# Patient Record
Sex: Female | Born: 1985 | Race: Black or African American | Hispanic: No | Marital: Single | Smoking: Current every day smoker
Health system: Southern US, Community
[De-identification: ages and names within clinical notes are randomized; demographics above are authoritative.]

## PROBLEM LIST (undated history)

## (undated) HISTORY — PX: OTHER SURGICAL HISTORY: SHX169

---

## 2009-01-12 ENCOUNTER — Emergency Department (HOSPITAL_BASED_OUTPATIENT_CLINIC_OR_DEPARTMENT_OTHER): Admission: EM | Admit: 2009-01-12 | Discharge: 2009-01-12 | Payer: Self-pay | Admitting: Emergency Medicine

## 2009-01-31 ENCOUNTER — Ambulatory Visit: Payer: Self-pay | Admitting: Interventional Radiology

## 2009-01-31 ENCOUNTER — Emergency Department (HOSPITAL_BASED_OUTPATIENT_CLINIC_OR_DEPARTMENT_OTHER): Admission: EM | Admit: 2009-01-31 | Discharge: 2009-01-31 | Payer: Self-pay | Admitting: Emergency Medicine

## 2009-02-16 ENCOUNTER — Emergency Department (HOSPITAL_BASED_OUTPATIENT_CLINIC_OR_DEPARTMENT_OTHER): Admission: EM | Admit: 2009-02-16 | Discharge: 2009-02-16 | Payer: Self-pay | Admitting: Emergency Medicine

## 2009-07-03 ENCOUNTER — Emergency Department (HOSPITAL_BASED_OUTPATIENT_CLINIC_OR_DEPARTMENT_OTHER): Admission: EM | Admit: 2009-07-03 | Discharge: 2009-07-03 | Payer: Self-pay | Admitting: Emergency Medicine

## 2010-05-12 ENCOUNTER — Emergency Department (HOSPITAL_BASED_OUTPATIENT_CLINIC_OR_DEPARTMENT_OTHER)
Admission: EM | Admit: 2010-05-12 | Discharge: 2010-05-12 | Payer: Self-pay | Source: Home / Self Care | Admitting: Emergency Medicine

## 2010-08-24 LAB — URINALYSIS, ROUTINE W REFLEX MICROSCOPIC
Bilirubin Urine: NEGATIVE
Nitrite: NEGATIVE
Specific Gravity, Urine: 1.024 (ref 1.005–1.030)
Urobilinogen, UA: 1 mg/dL (ref 0.0–1.0)

## 2010-08-24 LAB — BASIC METABOLIC PANEL
CO2: 29 mEq/L (ref 19–32)
Chloride: 106 mEq/L (ref 96–112)
Creatinine, Ser: 0.8 mg/dL (ref 0.4–1.2)
GFR calc Af Amer: >60 mL/min (ref >60)
Potassium: 4 mEq/L (ref 3.5–5.1)

## 2010-08-24 LAB — URINE CULTURE

## 2010-08-24 LAB — PREGNANCY, URINE: Preg Test, Ur: POSITIVE

## 2010-10-23 ENCOUNTER — Emergency Department (HOSPITAL_BASED_OUTPATIENT_CLINIC_OR_DEPARTMENT_OTHER)
Admission: EM | Admit: 2010-10-23 | Discharge: 2010-10-23 | Disposition: A | Discharge status: Home / Self Care | Payer: Self-pay | Attending: Emergency Medicine | Admitting: Emergency Medicine

## 2010-10-23 DIAGNOSIS — J329 Chronic sinusitis, unspecified: Secondary | ICD-10-CM | POA: Insufficient documentation

## 2010-10-23 DIAGNOSIS — J029 Acute pharyngitis, unspecified: Secondary | ICD-10-CM | POA: Insufficient documentation

## 2010-10-23 LAB — URINALYSIS, ROUTINE W REFLEX MICROSCOPIC
Nitrite: NEGATIVE
Specific Gravity, Urine: 1.029 (ref 1.005–1.030)
pH: 6 (ref 5.0–8.0)

## 2010-10-23 LAB — URINE MICROSCOPIC-ADD ON

## 2010-10-23 LAB — PREGNANCY, URINE: Preg Test, Ur: NEGATIVE

## 2011-01-02 ENCOUNTER — Emergency Department (HOSPITAL_BASED_OUTPATIENT_CLINIC_OR_DEPARTMENT_OTHER)
Admission: EM | Admit: 2011-01-02 | Discharge: 2011-01-02 | Disposition: A | Discharge status: Home / Self Care | Payer: Self-pay | Attending: Emergency Medicine | Admitting: Emergency Medicine

## 2011-01-02 DIAGNOSIS — M25532 Pain in left wrist: Secondary | ICD-10-CM

## 2011-01-02 DIAGNOSIS — M25539 Pain in unspecified wrist: Secondary | ICD-10-CM | POA: Insufficient documentation

## 2011-01-02 DIAGNOSIS — F17200 Nicotine dependence, unspecified, uncomplicated: Secondary | ICD-10-CM | POA: Insufficient documentation

## 2011-01-02 MED ORDER — NAPROXEN 500 MG PO TABS: 500.0000 mg | ORAL_TABLET | Freq: Two times a day (BID) | ORAL | Status: AC

## 2011-01-02 NOTE — ED Notes (Signed)
C/O left wrist pain that started Sunday.  Denies injury and no deformity noted.

## 2011-01-02 NOTE — ED Provider Notes (Signed)
History     CSN: [id number] Arrival date & time: 01/02/2011  9:43 AM  No chief complaint on file.  HPI Comments: Patient is a very healthy 25 year old female who presents with left wrist pain times several days. She states that this occurs every morning, seems to get better throughout the day. She notes that when she sleeps her left wrist is in forced volar flexion. She denies fevers, redness, swelling of the joint and has no other arthralgias or arthropathies. She has no history of gout and has no symptoms consistent with a gonorrhea infection. Symptoms are mild at this time. Symptoms get worse with range of motion of the left wrist.  The history is provided by the patient and a relative.    History reviewed. No pertinent past medical history.  History reviewed. No pertinent past surgical history.  No family history on file.  History  Substance Use Topics  . Smoking status: Current Everyday Smoker -- 0.5 packs/day  . Smokeless tobacco: Not on file  . Alcohol Use: Yes     occasionally    OB History    Grav Para Term Preterm Abortions TAB SAB Ect Mult Living                  Review of Systems  Constitutional: Negative for fever.  Musculoskeletal: Positive for arthralgias. Negative for myalgias and joint swelling.  Skin: Negative for rash.    Physical Exam  BP 109/73  Pulse 75  Temp(Src) 98.7 F (37.1 C) (Oral)  Resp 16  Ht 5\' 8"  (1.727 m)  Wt 190 lb (86.183 kg)  BMI 28.89 kg/m2  SpO2 99%  LMP 12/26/2010  Physical Exam  Nursing note and vitals reviewed. Constitutional: She appears well-developed and well-nourished. No distress.  Eyes: Conjunctivae are normal. No scleral icterus.  Cardiovascular: Normal rate, regular rhythm and normal heart sounds.   Pulmonary/Chest: Effort normal and breath sounds normal.  Musculoskeletal: Normal range of motion. She exhibits tenderness (Mild tenderness of the left wrist with range of motion. No swelling of the left wrist. Normal  grip and sensation of the left hand). She exhibits no edema.       No other pain in joints other than the left wrist.  Neurological: She is alert. Coordination normal.       Sensation and grip normal the left upper extremity.  Skin: Skin is warm and dry. No rash noted. She is not diaphoretic.    ED Course  Procedures  MDM Overall patient is well-appearing with no signs of significant arthropathy. Given history will give patient a splint that she is more at night to prevent her wrist from going into hyperflexion. Anti-inflammatories for home. Indications for follow up explained to patient and understanding expressed.      Vida Roller, MD 01/02/11 1009

## 2013-01-25 ENCOUNTER — Emergency Department (HOSPITAL_BASED_OUTPATIENT_CLINIC_OR_DEPARTMENT_OTHER)
Admission: EM | Admit: 2013-01-25 | Discharge: 2013-01-26 | Disposition: A | Discharge status: Home / Self Care | Payer: Self-pay | Attending: Emergency Medicine | Admitting: Emergency Medicine

## 2013-01-25 ENCOUNTER — Encounter (HOSPITAL_BASED_OUTPATIENT_CLINIC_OR_DEPARTMENT_OTHER): Payer: Self-pay

## 2013-01-25 ENCOUNTER — Emergency Department (HOSPITAL_BASED_OUTPATIENT_CLINIC_OR_DEPARTMENT_OTHER): Payer: Self-pay

## 2013-01-25 DIAGNOSIS — Z3202 Encounter for pregnancy test, result negative: Secondary | ICD-10-CM | POA: Insufficient documentation

## 2013-01-25 DIAGNOSIS — N390 Urinary tract infection, site not specified: Secondary | ICD-10-CM | POA: Insufficient documentation

## 2013-01-25 DIAGNOSIS — R1110 Vomiting, unspecified: Secondary | ICD-10-CM | POA: Insufficient documentation

## 2013-01-25 DIAGNOSIS — R079 Chest pain, unspecified: Secondary | ICD-10-CM | POA: Insufficient documentation

## 2013-01-25 DIAGNOSIS — F17200 Nicotine dependence, unspecified, uncomplicated: Secondary | ICD-10-CM | POA: Insufficient documentation

## 2013-01-25 LAB — URINE MICROSCOPIC-ADD ON

## 2013-01-25 LAB — URINALYSIS, ROUTINE W REFLEX MICROSCOPIC
Bilirubin Urine: NEGATIVE
Glucose, UA: NEGATIVE mg/dL
Ketones, ur: >80 mg/dL — AB
Protein, ur: 30 mg/dL — AB
pH: 8.5 — ABNORMAL HIGH (ref 5.0–8.0)

## 2013-01-25 LAB — CBC WITH DIFFERENTIAL/PLATELET
Basophils Absolute: 0 10*3/uL (ref 0.0–0.1)
Eosinophils Absolute: 0 10*3/uL (ref 0.0–0.7)
Eosinophils Relative: 0 % (ref 0–5)
Lymphocytes Relative: 14 % (ref 12–46)
Lymphs Abs: 1.4 10*3/uL (ref 0.7–4.0)
MCH: 32.8 pg (ref 26.0–34.0)
Neutrophils Relative %: 82 % — ABNORMAL HIGH (ref 43–77)
Platelets: 197 10*3/uL (ref 150–400)
RBC: 4.33 MIL/uL (ref 3.87–5.11)
RDW: 11.2 % — ABNORMAL LOW (ref 11.5–15.5)
WBC: 9.9 10*3/uL (ref 4.0–10.5)

## 2013-01-25 LAB — COMPREHENSIVE METABOLIC PANEL
ALT: 11 U/L (ref 0–35)
AST: 19 U/L (ref 0–37)
Alkaline Phosphatase: 51 U/L (ref 39–117)
Calcium: 9.9 mg/dL (ref 8.4–10.5)
GFR calc Af Amer: >90 mL/min (ref >90)
Glucose, Bld: 150 mg/dL — ABNORMAL HIGH (ref 70–99)
Potassium: 3.3 mEq/L — ABNORMAL LOW (ref 3.5–5.1)
Sodium: 138 mEq/L (ref 135–145)
Total Protein: 7.2 g/dL (ref 6.0–8.3)

## 2013-01-25 MED ORDER — DEXTROSE 5 % IV SOLN
1.0000 g | Freq: Once | INTRAVENOUS | Status: AC
  Administered 2013-01-25: 1 g via INTRAVENOUS
  Filled 2013-01-25: qty 10

## 2013-01-25 MED ORDER — HYDROMORPHONE HCL PF 1 MG/ML IJ SOLN
0.5000 mg | Freq: Once | INTRAMUSCULAR | Status: AC
  Administered 2013-01-25: 0.5 mg via INTRAVENOUS
  Filled 2013-01-25: qty 1

## 2013-01-25 MED ORDER — ONDANSETRON HCL 4 MG/2ML IJ SOLN
4.0000 mg | Freq: Once | INTRAMUSCULAR | Status: AC
  Administered 2013-01-25: 4 mg via INTRAVENOUS
  Filled 2013-01-25: qty 2

## 2013-01-25 MED ORDER — FAMOTIDINE IN NACL 20-0.9 MG/50ML-% IV SOLN
20.0000 mg | Freq: Once | INTRAVENOUS | Status: AC
  Administered 2013-01-25: 20 mg via INTRAVENOUS
  Filled 2013-01-25: qty 50

## 2013-01-25 MED ORDER — SODIUM CHLORIDE 0.9 % IV SOLN
1000.0000 mL | Freq: Once | INTRAVENOUS | Status: AC
  Administered 2013-01-25: 1000 mL via INTRAVENOUS

## 2013-01-25 MED ORDER — HYDROMORPHONE HCL PF 1 MG/ML IJ SOLN
0.5000 mg | Freq: Once | INTRAMUSCULAR | Status: AC
  Administered 2013-01-26: 0.5 mg via INTRAVENOUS
  Filled 2013-01-25: qty 1

## 2013-01-25 MED ORDER — GI COCKTAIL ~~LOC~~
30.0000 mL | Freq: Once | ORAL | Status: AC
  Administered 2013-01-25: 30 mL via ORAL
  Filled 2013-01-25: qty 30

## 2013-01-25 NOTE — ED Notes (Signed)
MD at bedside. 

## 2013-01-25 NOTE — ED Notes (Signed)
abd pain, chest pain, chills since 4pm

## 2013-01-25 NOTE — ED Provider Notes (Addendum)
CSN: [id number]     Arrival date & time 01/25/13  2000 History  This chart was scribed for Gerhard Munch, MD by Greggory Stallion, ED Scribe. This patient was seen in room MH01/MH01 and the patient's care was started at 8:39 PM.    Chief Complaint  Patient presents with  . Abdominal Pain  . Chest Pain   (Consider location/radiation/quality/duration/timing/severity/associated sxs/prior Treatment) The history is provided by the patient. No language interpreter was used.   HPI Comments: Laurie Key is a 27 y.o. female who presents to the Emergency Department complaining of abdominal pain and CP that start about 4 PM today. Pt also states the pain is right about her right breast , and she also experienced nausea and diarrhea shortly after the pain started. Pt denies fever and confusion. Pt states she currently works at [employer].   History reviewed. No pertinent past medical history. History reviewed. No pertinent past surgical history. No family history on file. History  Substance Use Topics  . Smoking status: Current Every Day Smoker -- 0.50 packs/day  . Smokeless tobacco: Not on file  . Alcohol Use: No   OB History   Grav Para Term Preterm Abortions TAB SAB Ect Mult Living                 Review of Systems  Constitutional:       Per HPI, otherwise negative  HENT:       Per HPI, otherwise negative  Respiratory:       Per HPI, otherwise negative  Cardiovascular: Positive for chest pain.       Per HPI, otherwise negative  Gastrointestinal: Positive for vomiting.  Endocrine:       Negative aside from HPI  Genitourinary:       Neg aside from HPI   Musculoskeletal:       Per HPI, otherwise negative  Skin: Negative.   Neurological: Negative for syncope.    Allergies  Review of patient's allergies indicates no known allergies.  Home Medications  No current outpatient prescriptions on file. BP 142/90  Pulse 62  Temp(Src) 98.3 F (36.8 C) (Oral)  Resp 20  Ht 5\' 8"   (1.727 m)  Wt 170 lb (77.111 kg)  BMI 25.85 kg/m2  SpO2 100%  LMP 01/19/2013 Physical Exam  Nursing note and vitals reviewed. Constitutional: She is oriented to person, place, and time. She appears well-developed and well-nourished. No distress.  HENT:  Head: Normocephalic and atraumatic.  Eyes: Conjunctivae and EOM are normal.  Cardiovascular: Normal rate and regular rhythm.   Pulmonary/Chest: Effort normal and breath sounds normal. No stridor. No respiratory distress.  Abdominal: Soft. She exhibits no distension.  Epigastric tenderness  Musculoskeletal: She exhibits no edema.  Neurological: She is alert and oriented to person, place, and time. No cranial nerve deficit.  Skin: Skin is warm and dry.  Psychiatric: She has a normal mood and affect.    ED Course  Procedures (including critical care time)  DIAGNOSTIC STUDIES: Oxygen Saturation is 100% on RA, Normal by my interpretation.    COORDINATION OF CARE: 9:10 PM-Discussed treatment plan which includes pain medication and labs. Pt agreed to plan.     Labs Review Labs Reviewed  URINALYSIS, ROUTINE W REFLEX MICROSCOPIC - Abnormal; Notable for the following:    APPearance TURBID (*)    pH 8.5 (*)    Hgb urine dipstick SMALL (*)    Ketones, ur >80 (*)    Protein, ur 30 (*)  Leukocytes, UA SMALL (*)    All other components within normal limits  URINE MICROSCOPIC-ADD ON - Abnormal; Notable for the following:    Squamous Epithelial / LPF FEW (*)    All other components within normal limits  PREGNANCY, URINE   Imaging Review No results found.   EKG has a sinus bradycardia, with sinus arrhythmia, rate 58, otherwise unremarkable.  11:02 PM Patient marginally better.  Labs demonstrate ketonuria, and patient is receiving IVF.  MDM  No diagnosis found. I personally performed the services described in this documentation, which was scribed in my presence. The recorded information has been reviewed and is  accurate.  Patient presents with nausea, vomiting, abdominal pain, chest.  On exam she is awake alert, but appears uncomfortable.  The patient's EKG, chest x-ray are unremarkable, and given the absence of risk factors, the youth, there is low suspicion for ongoing coronary disease. Patient's labs did demonstrate a likely urinary tract infection, with ketonuria.  The patient improved here with IV fluids.  She started on antibiotics, discharged in stable condition to follow up with a primary care provider.  Resources were provided.   Gerhard Munch, MD 01/25/13 [id number]  Gerhard Munch, MD 01/26/13 0000

## 2013-01-25 NOTE — ED Notes (Signed)
Patient transported to X-ray and returned 

## 2013-01-26 MED ORDER — TRAMADOL HCL 50 MG PO TABS: 50.0000 mg | ORAL_TABLET | Freq: Four times a day (QID) | ORAL | Status: DC | PRN

## 2013-01-26 MED ORDER — CEPHALEXIN 500 MG PO CAPS: 500.0000 mg | ORAL_CAPSULE | Freq: Two times a day (BID) | ORAL | Status: AC

## 2013-01-26 MED ORDER — METOCLOPRAMIDE HCL 10 MG PO TABS: 10.0000 mg | ORAL_TABLET | Freq: Four times a day (QID) | ORAL | Status: DC | PRN

## 2013-01-26 NOTE — ED Notes (Signed)
rx x 3 given at d/c for tramadol, keflex and reglan- pt has a ride

## 2013-07-11 ENCOUNTER — Emergency Department (HOSPITAL_BASED_OUTPATIENT_CLINIC_OR_DEPARTMENT_OTHER)
Admission: EM | Admit: 2013-07-11 | Discharge: 2013-07-11 | Disposition: A | Discharge status: Home / Self Care | Payer: Self-pay | Attending: Emergency Medicine | Admitting: Emergency Medicine

## 2013-07-11 ENCOUNTER — Encounter (HOSPITAL_BASED_OUTPATIENT_CLINIC_OR_DEPARTMENT_OTHER): Payer: Self-pay | Admitting: Emergency Medicine

## 2013-07-11 DIAGNOSIS — R6883 Chills (without fever): Secondary | ICD-10-CM | POA: Insufficient documentation

## 2013-07-11 DIAGNOSIS — R112 Nausea with vomiting, unspecified: Secondary | ICD-10-CM | POA: Insufficient documentation

## 2013-07-11 DIAGNOSIS — F17200 Nicotine dependence, unspecified, uncomplicated: Secondary | ICD-10-CM | POA: Insufficient documentation

## 2013-07-11 DIAGNOSIS — Z3202 Encounter for pregnancy test, result negative: Secondary | ICD-10-CM | POA: Insufficient documentation

## 2013-07-11 DIAGNOSIS — R109 Unspecified abdominal pain: Secondary | ICD-10-CM | POA: Insufficient documentation

## 2013-07-11 DIAGNOSIS — R197 Diarrhea, unspecified: Secondary | ICD-10-CM | POA: Insufficient documentation

## 2013-07-11 LAB — URINE MICROSCOPIC-ADD ON

## 2013-07-11 LAB — CBC WITH DIFFERENTIAL/PLATELET
BASOS ABS: 0 10*3/uL (ref 0.0–0.1)
BASOS PCT: 0 % (ref 0–1)
EOS PCT: 0 % (ref 0–5)
Eosinophils Absolute: 0 10*3/uL (ref 0.0–0.7)
HCT: 40.1 % (ref 36.0–46.0)
HEMOGLOBIN: 14.1 g/dL (ref 12.0–15.0)
Lymphocytes Relative: 3 % — ABNORMAL LOW (ref 12–46)
Lymphs Abs: 0.3 10*3/uL — ABNORMAL LOW (ref 0.7–4.0)
MCH: 32.8 pg (ref 26.0–34.0)
MCHC: 35.2 g/dL (ref 30.0–36.0)
MCV: 93.3 fL (ref 78.0–100.0)
Monocytes Absolute: 0.4 10*3/uL (ref 0.1–1.0)
Monocytes Relative: 3 % (ref 3–12)
NEUTROS ABS: 12.5 10*3/uL — AB (ref 1.7–7.7)
NEUTROS PCT: 94 % — AB (ref 43–77)
Platelets: 195 10*3/uL (ref 150–400)
RBC: 4.3 MIL/uL (ref 3.87–5.11)
RDW: 11.7 % (ref 11.5–15.5)
WBC: 13.3 10*3/uL — AB (ref 4.0–10.5)

## 2013-07-11 LAB — BASIC METABOLIC PANEL
BUN: 12 mg/dL (ref 6–23)
CO2: 21 meq/L (ref 19–32)
Calcium: 9.5 mg/dL (ref 8.4–10.5)
Chloride: 103 mEq/L (ref 96–112)
Creatinine, Ser: 0.6 mg/dL (ref 0.50–1.10)
GFR calc Af Amer: >90 mL/min (ref >90)
GFR calc non Af Amer: >90 mL/min (ref >90)
Glucose, Bld: 133 mg/dL — ABNORMAL HIGH (ref 70–99)
Potassium: 4 mEq/L (ref 3.7–5.3)
Sodium: 141 mEq/L (ref 137–147)

## 2013-07-11 LAB — URINALYSIS, ROUTINE W REFLEX MICROSCOPIC
Bilirubin Urine: NEGATIVE
Glucose, UA: NEGATIVE mg/dL
Ketones, ur: >80 mg/dL — AB
Nitrite: NEGATIVE
PH: 7.5 (ref 5.0–8.0)
Protein, ur: 30 mg/dL — AB
Specific Gravity, Urine: 1.026 (ref 1.005–1.030)
UROBILINOGEN UA: 0.2 mg/dL (ref 0.0–1.0)

## 2013-07-11 LAB — LIPASE, BLOOD: LIPASE: 11 U/L (ref 11–59)

## 2013-07-11 LAB — PREGNANCY, URINE: Preg Test, Ur: NEGATIVE

## 2013-07-11 MED ORDER — ONDANSETRON HCL 4 MG/2ML IJ SOLN: 4.0000 mg | Freq: Once | INTRAMUSCULAR | Status: DC

## 2013-07-11 MED ORDER — ONDANSETRON 8 MG PO TBDP
ORAL_TABLET | ORAL | Status: AC
  Administered 2013-07-11: 8 mg via ORAL
  Filled 2013-07-11: qty 1

## 2013-07-11 MED ORDER — ONDANSETRON 8 MG PO TBDP
ORAL_TABLET | ORAL | Status: AC
  Filled 2013-07-11: qty 1

## 2013-07-11 MED ORDER — ONDANSETRON HCL 4 MG/2ML IJ SOLN
4.0000 mg | Freq: Once | INTRAMUSCULAR | Status: AC
  Administered 2013-07-11: 4 mg via INTRAVENOUS

## 2013-07-11 MED ORDER — ONDANSETRON 8 MG PO TBDP
8.0000 mg | ORAL_TABLET | Freq: Once | ORAL | Status: DC
  Administered 2013-07-11 (×2): 8 mg via ORAL

## 2013-07-11 MED ORDER — ONDANSETRON HCL 4 MG/2ML IJ SOLN
INTRAMUSCULAR | Status: AC
  Filled 2013-07-11: qty 2

## 2013-07-11 MED ORDER — SODIUM CHLORIDE 0.9 % IV BOLUS (SEPSIS)
1000.0000 mL | Freq: Once | INTRAVENOUS | Status: AC
  Administered 2013-07-11: 1000 mL via INTRAVENOUS

## 2013-07-11 MED ORDER — ONDANSETRON 8 MG PO TBDP: 8.0000 mg | ORAL_TABLET | Freq: Three times a day (TID) | ORAL | Status: DC | PRN

## 2013-07-11 NOTE — Discharge Instructions (Signed)
Viral Gastroenteritis Viral gastroenteritis is also known as stomach flu. This condition affects the stomach and intestinal tract. It can cause sudden diarrhea and vomiting. The illness typically lasts 3 to 8 days. Most people develop an immune response that eventually gets rid of the virus. While this natural response develops, the virus can make you quite ill. CAUSES  Many different viruses can cause gastroenteritis, such as rotavirus or noroviruses. You can catch one of these viruses by consuming contaminated food or water. You may also catch a virus by sharing utensils or other personal items with an infected person or by touching a contaminated surface. SYMPTOMS  The most common symptoms are diarrhea and vomiting. These problems can cause a severe loss of body fluids (dehydration) and a body salt (electrolyte) imbalance. Other symptoms may include:  Fever.  Headache.  Fatigue.  Abdominal pain. DIAGNOSIS  Your caregiver can usually diagnose viral gastroenteritis based on your symptoms and a physical exam. A stool sample may also be taken to test for the presence of viruses or other infections. TREATMENT  This illness typically goes away on its own. Treatments are aimed at rehydration. The most serious cases of viral gastroenteritis involve vomiting so severely that you are not able to keep fluids down. In these cases, fluids must be given through an intravenous line (IV). HOME CARE INSTRUCTIONS   Drink enough fluids to keep your urine clear or pale yellow. Drink small amounts of fluids frequently and increase the amounts as tolerated.  Ask your caregiver for specific rehydration instructions.  Avoid:  Foods high in sugar.  Alcohol.  Carbonated drinks.  Tobacco.  Juice.  Caffeine drinks.  Extremely hot or cold fluids.  Fatty, greasy foods.  Too much intake of anything at one time.  Dairy products until 24 to 48 hours after diarrhea stops.  You may consume probiotics.  Probiotics are active cultures of beneficial bacteria. They may lessen the amount and number of diarrheal stools in adults. Probiotics can be found in yogurt with active cultures and in supplements.  Wash your hands well to avoid spreading the virus.  Only take over-the-counter or prescription medicines for pain, discomfort, or fever as directed by your caregiver. Do not give aspirin to children. Antidiarrheal medicines are not recommended.  Ask your caregiver if you should continue to take your regular prescribed and over-the-counter medicines.  Keep all follow-up appointments as directed by your caregiver. SEEK IMMEDIATE MEDICAL CARE IF:   You are unable to keep fluids down.  You do not urinate at least once every 6 to 8 hours.  You develop shortness of breath.  You notice blood in your stool or vomit. This may look like coffee grounds.  You have abdominal pain that increases or is concentrated in one small area (localized).  You have persistent vomiting or diarrhea.  You have a fever.  The patient is a child younger than 3 months, and he or she has a fever.  The patient is a child older than 3 months, and he or she has a fever and persistent symptoms.  The patient is a child older than 3 months, and he or she has a fever and symptoms suddenly get worse.  The patient is a baby, and he or she has no tears when crying. MAKE SURE YOU:   Understand these instructions.  Will watch your condition.  Will get help right away if you are not doing well or get worse. Document Released: 05/06/2005 Document Revised: 07/29/2011 Document Reviewed: 02/20/2011   [employer] Patient Information 2014 [employer]  

## 2013-07-11 NOTE — ED Notes (Signed)
Patient states that she began having N/V/D this afternoon. Feels weak and dehydrated.

## 2013-07-11 NOTE — ED Provider Notes (Signed)
CSN: [id number]     Arrival date & time 07/11/13  1701 History  This chart was scribed for Hilario Quarryanielle S Tiyana Galla, MD by Nicholos Johnsenise Iheanachor, ED scribe. This patient was seen in room MH06/MH06 and the patient's care was started at 6:04 PM.  Chief Complaint  Patient presents with  . Emesis  . Diarrhea   Patient is a 28 y.o. female presenting with vomiting and diarrhea. The history is provided by the patient. No language interpreter was used.  Emesis Severity:  Moderate Progression:  Unchanged Chronicity:  New Associated symptoms: abdominal pain and diarrhea   Diarrhea:    Severity:  Moderate   Progression:  Unchanged Risk factors: sick contacts   Risk factors: not pregnant now   Diarrhea Associated symptoms: abdominal pain and vomiting    HPI Comments: Laurie Key is a 28 y.o. female who presents to the Emergency Department complaining of nausea, diarrhea, vomiting, and abdominal pain, onset 4 AM this morning. Pt states she has thrown up "a lot." Pt states she had chills but is unsure if she ever had a fever. Reported feeling like she had to throw up when laid prone on the examination bed. Pt has sick contact at home. Pt does not take medications daily and has no known allergies. States last period was normal and no chance of pregnancy at the moment.  History reviewed. No pertinent past medical history. History reviewed. No pertinent past surgical history. No family history on file. History  Substance Use Topics  . Smoking status: Current Every Day Smoker -- 0.50 packs/day  . Smokeless tobacco: Not on file  . Alcohol Use: No   OB History   Grav Para Term Preterm Abortions TAB SAB Ect Mult Living                 Review of Systems  Gastrointestinal: Positive for nausea, vomiting, abdominal pain and diarrhea.  All other systems reviewed and are negative.   Allergies  Review of patient's allergies indicates no known allergies.  Home Medications   Current Outpatient Rx  Name   Route  Sig  Dispense  Refill  . metoCLOPramide (REGLAN) 10 MG tablet   Oral   Take 1 tablet (10 mg total) by mouth every 6 (six) hours as needed (nausea).   15 tablet   0   . traMADol (ULTRAM) 50 MG tablet   Oral   Take 1 tablet (50 mg total) by mouth every 6 (six) hours as needed for pain.   15 tablet   0    Triage Vitals: BP 113/69  Pulse 76  Temp(Src) 97.9 F (36.6 C) (Oral)  Resp 24  Ht 5\' 8"  (1.727 m)  Wt 170 lb (77.111 kg)  BMI 25.85 kg/m2  SpO2 100% Physical Exam  Nursing note and vitals reviewed. Constitutional: She is oriented to person, place, and time. She appears well-developed and well-nourished.  HENT:  Head: Normocephalic and atraumatic.  Nose: Nose normal.  Mouth/Throat: Oropharynx is clear and moist.  Eyes: Conjunctivae and EOM are normal. Pupils are equal, round, and reactive to light.  Neck: Normal range of motion.  Cardiovascular: Normal rate and normal heart sounds.   Pulmonary/Chest: Effort normal. No respiratory distress.  Abdominal: Soft. There is no tenderness.  Musculoskeletal: Normal range of motion. She exhibits no tenderness.  Lymphadenopathy:    She has no cervical adenopathy.  Neurological: She is oriented to person, place, and time. She has normal reflexes.  Skin: Skin is warm and dry.  Psychiatric:  She has a normal mood and affect. Her behavior is normal.    ED Course  Procedures (including critical care time) DIAGNOSTIC STUDIES: Oxygen Saturation is 100% on room air, normal by my interpretation.    COORDINATION OF CARE: At 6:07 PM: Discussed treatment plan with patient which includes giving fluids and treating sx. Patient agrees.   Labs Review Labs Reviewed  PREGNANCY, URINE  URINALYSIS, ROUTINE W REFLEX MICROSCOPIC  CBC WITH DIFFERENTIAL  BASIC METABOLIC PANEL  LIPASE, BLOOD   MDM   Final diagnoses:  Nausea, vomiting and diarrhea    I personally performed the services described in this documentation, which was scribed  in my presence. The recorded information has been reviewed and considered.      Hilario Quarry, MD 07/16/13 [phone]

## 2015-01-28 ENCOUNTER — Emergency Department (HOSPITAL_BASED_OUTPATIENT_CLINIC_OR_DEPARTMENT_OTHER)
Admission: EM | Admit: 2015-01-28 | Discharge: 2015-01-28 | Discharge status: Against Medical Advice | Payer: Medicaid Other | Attending: Emergency Medicine | Admitting: Emergency Medicine

## 2015-01-28 ENCOUNTER — Encounter (HOSPITAL_BASED_OUTPATIENT_CLINIC_OR_DEPARTMENT_OTHER): Payer: Self-pay | Admitting: Emergency Medicine

## 2015-01-28 DIAGNOSIS — Z720 Tobacco use: Secondary | ICD-10-CM | POA: Diagnosis not present

## 2015-01-28 DIAGNOSIS — R110 Nausea: Secondary | ICD-10-CM | POA: Diagnosis not present

## 2015-01-28 DIAGNOSIS — R51 Headache: Secondary | ICD-10-CM | POA: Insufficient documentation

## 2015-01-28 NOTE — ED Notes (Addendum)
Pt c/o headache for past 2 days, posterior base of skull.  Mild relief with applied pressure.  Taking "BC's" at home with no relief.  Also states mild nausea.  No vision changes or other symptoms at this time

## 2020-02-25 ENCOUNTER — Emergency Department (HOSPITAL_BASED_OUTPATIENT_CLINIC_OR_DEPARTMENT_OTHER): Payer: BC Managed Care – PPO

## 2020-02-25 ENCOUNTER — Encounter (HOSPITAL_COMMUNITY): Payer: Self-pay | Admitting: Certified Registered"

## 2020-02-25 ENCOUNTER — Inpatient Hospital Stay (HOSPITAL_COMMUNITY)
Admission: RE | Admit: 2020-02-25 | Payer: BC Managed Care – PPO | Source: Ambulatory Visit | Admitting: Orthopedic Surgery

## 2020-02-25 ENCOUNTER — Emergency Department (HOSPITAL_COMMUNITY): Payer: BC Managed Care – PPO

## 2020-02-25 ENCOUNTER — Encounter (HOSPITAL_COMMUNITY)
Admission: EM | Disposition: A | Discharge status: Home / Self Care | Payer: Self-pay | Source: Home / Self Care | Attending: Emergency Medicine

## 2020-02-25 ENCOUNTER — Encounter (HOSPITAL_BASED_OUTPATIENT_CLINIC_OR_DEPARTMENT_OTHER): Payer: Self-pay | Admitting: Emergency Medicine

## 2020-02-25 ENCOUNTER — Emergency Department (HOSPITAL_COMMUNITY): Payer: BC Managed Care – PPO | Admitting: Certified Registered"

## 2020-02-25 ENCOUNTER — Other Ambulatory Visit: Payer: Self-pay

## 2020-02-25 ENCOUNTER — Ambulatory Visit (HOSPITAL_BASED_OUTPATIENT_CLINIC_OR_DEPARTMENT_OTHER)
Admission: EM | Admit: 2020-02-25 | Discharge: 2020-02-25 | Disposition: A | Discharge status: Home / Self Care | Payer: BC Managed Care – PPO | Attending: Orthopedic Surgery | Admitting: Orthopedic Surgery

## 2020-02-25 DIAGNOSIS — S62635B Displaced fracture of distal phalanx of left ring finger, initial encounter for open fracture: Secondary | ICD-10-CM | POA: Insufficient documentation

## 2020-02-25 DIAGNOSIS — W230XXA Caught, crushed, jammed, or pinched between moving objects, initial encounter: Secondary | ICD-10-CM | POA: Insufficient documentation

## 2020-02-25 DIAGNOSIS — S6710XA Crushing injury of unspecified finger(s), initial encounter: Secondary | ICD-10-CM | POA: Diagnosis present

## 2020-02-25 DIAGNOSIS — Z20822 Contact with and (suspected) exposure to covid-19: Secondary | ICD-10-CM | POA: Insufficient documentation

## 2020-02-25 DIAGNOSIS — Y9389 Activity, other specified: Secondary | ICD-10-CM | POA: Insufficient documentation

## 2020-02-25 DIAGNOSIS — Z79899 Other long term (current) drug therapy: Secondary | ICD-10-CM | POA: Insufficient documentation

## 2020-02-25 DIAGNOSIS — F17210 Nicotine dependence, cigarettes, uncomplicated: Secondary | ICD-10-CM | POA: Diagnosis not present

## 2020-02-25 HISTORY — PX: I & D EXTREMITY: SHX5045

## 2020-02-25 HISTORY — PX: CLOSED REDUCTION FINGER WITH PERCUTANEOUS PINNING: SHX5612

## 2020-02-25 LAB — HCG, SERUM, QUALITATIVE: Preg, Serum: NEGATIVE

## 2020-02-25 LAB — RESPIRATORY PANEL BY RT PCR (FLU A&B, COVID)
Influenza A by PCR: NEGATIVE
Influenza B by PCR: NEGATIVE
SARS Coronavirus 2 by RT PCR: NEGATIVE

## 2020-02-25 LAB — CBC
HCT: 38.7 % (ref 36.0–46.0)
Hemoglobin: 12.6 g/dL (ref 12.0–15.0)
MCH: 32 pg (ref 26.0–34.0)
MCHC: 32.6 g/dL (ref 30.0–36.0)
MCV: 98.2 fL (ref 80.0–100.0)
Platelets: 218 10*3/uL (ref 150–400)
RBC: 3.94 MIL/uL (ref 3.87–5.11)
RDW: 12.1 % (ref 11.5–15.5)
WBC: 8.8 10*3/uL (ref 4.0–10.5)
nRBC: 0 % (ref 0.0–0.2)

## 2020-02-25 LAB — SURGICAL PCR SCREEN
MRSA, PCR: NEGATIVE
Staphylococcus aureus: NEGATIVE

## 2020-02-25 SURGERY — IRRIGATION AND DEBRIDEMENT EXTREMITY
Anesthesia: Choice | Site: Finger | Laterality: Left

## 2020-02-25 SURGERY — IRRIGATION AND DEBRIDEMENT EXTREMITY
Anesthesia: General | Site: Finger | Laterality: Left

## 2020-02-25 MED ORDER — ACETAMINOPHEN 500 MG PO TABS
1000.0000 mg | ORAL_TABLET | Freq: Once | ORAL | Status: AC
  Administered 2020-02-25: 1000 mg via ORAL
  Filled 2020-02-25: qty 2

## 2020-02-25 MED ORDER — DEXAMETHASONE SODIUM PHOSPHATE 4 MG/ML IJ SOLN
INTRAMUSCULAR | Status: DC | PRN
  Administered 2020-02-25: 5 mg via INTRAVENOUS

## 2020-02-25 MED ORDER — BUPIVACAINE HCL (PF) 0.25 % IJ SOLN
INTRAMUSCULAR | Status: DC | PRN
  Administered 2020-02-25: 5 mL

## 2020-02-25 MED ORDER — LIDOCAINE 2% (20 MG/ML) 5 ML SYRINGE
INTRAMUSCULAR | Status: AC
  Filled 2020-02-25: qty 5

## 2020-02-25 MED ORDER — MORPHINE SULFATE (PF) 4 MG/ML IV SOLN
4.0000 mg | Freq: Once | INTRAVENOUS | Status: AC
  Administered 2020-02-25: 4 mg via INTRAVENOUS
  Filled 2020-02-25: qty 1

## 2020-02-25 MED ORDER — FENTANYL CITRATE (PF) 100 MCG/2ML IJ SOLN: 25.0000 ug | INTRAMUSCULAR | Status: DC | PRN

## 2020-02-25 MED ORDER — IRRISEPT - 450ML BOTTLE WITH 0.05% CHG IN STERILE WATER, USP 99.95% OPTIME
TOPICAL | Status: DC | PRN
  Administered 2020-02-25: 450 mL

## 2020-02-25 MED ORDER — PHENYLEPHRINE 40 MCG/ML (10ML) SYRINGE FOR IV PUSH (FOR BLOOD PRESSURE SUPPORT)
PREFILLED_SYRINGE | INTRAVENOUS | Status: DC | PRN
  Administered 2020-02-25 (×2): 80 ug via INTRAVENOUS

## 2020-02-25 MED ORDER — PROMETHAZINE HCL 25 MG/ML IJ SOLN: 6.2500 mg | INTRAMUSCULAR | Status: DC | PRN

## 2020-02-25 MED ORDER — BUPIVACAINE HCL (PF) 0.5 % IJ SOLN
10.0000 mL | Freq: Once | INTRAMUSCULAR | Status: DC
  Filled 2020-02-25: qty 10

## 2020-02-25 MED ORDER — PROPOFOL 10 MG/ML IV BOLUS
INTRAVENOUS | Status: DC | PRN
  Administered 2020-02-25: 200 mg via INTRAVENOUS

## 2020-02-25 MED ORDER — CEFAZOLIN SODIUM-DEXTROSE 2-4 GM/100ML-% IV SOLN
2.0000 g | INTRAVENOUS | Status: AC
  Administered 2020-02-25: 2 g via INTRAVENOUS
  Filled 2020-02-25: qty 100

## 2020-02-25 MED ORDER — SODIUM CHLORIDE 0.9 % IR SOLN
Status: DC | PRN
  Administered 2020-02-25: 3000 mL

## 2020-02-25 MED ORDER — MIDAZOLAM HCL 2 MG/2ML IJ SOLN
INTRAMUSCULAR | Status: AC
  Filled 2020-02-25: qty 2

## 2020-02-25 MED ORDER — CELECOXIB 200 MG PO CAPS
200.0000 mg | ORAL_CAPSULE | Freq: Once | ORAL | Status: AC
  Administered 2020-02-25: 200 mg via ORAL
  Filled 2020-02-25: qty 1

## 2020-02-25 MED ORDER — LACTATED RINGERS IV SOLN: INTRAVENOUS | Status: DC | PRN

## 2020-02-25 MED ORDER — PROPOFOL 10 MG/ML IV BOLUS
INTRAVENOUS | Status: AC
  Filled 2020-02-25: qty 20

## 2020-02-25 MED ORDER — FENTANYL CITRATE (PF) 250 MCG/5ML IJ SOLN
INTRAMUSCULAR | Status: AC
  Filled 2020-02-25: qty 5

## 2020-02-25 MED ORDER — MIDAZOLAM HCL 5 MG/5ML IJ SOLN
INTRAMUSCULAR | Status: DC | PRN
  Administered 2020-02-25: 2 mg via INTRAVENOUS

## 2020-02-25 MED ORDER — DEXAMETHASONE SODIUM PHOSPHATE 10 MG/ML IJ SOLN
INTRAMUSCULAR | Status: AC
  Filled 2020-02-25: qty 1

## 2020-02-25 MED ORDER — CEFAZOLIN SODIUM-DEXTROSE 2-4 GM/100ML-% IV SOLN
2.0000 g | Freq: Once | INTRAVENOUS | Status: AC
  Administered 2020-02-25: 2 g via INTRAVENOUS
  Filled 2020-02-25: qty 100

## 2020-02-25 MED ORDER — 0.9 % SODIUM CHLORIDE (POUR BTL) OPTIME
TOPICAL | Status: DC | PRN
  Administered 2020-02-25: 1000 mL

## 2020-02-25 MED ORDER — LIDOCAINE 2% (20 MG/ML) 5 ML SYRINGE
INTRAMUSCULAR | Status: DC | PRN
  Administered 2020-02-25: 60 mg via INTRAVENOUS

## 2020-02-25 MED ORDER — CHLORHEXIDINE GLUCONATE 4 % EX LIQD: 60.0000 mL | Freq: Once | CUTANEOUS | Status: DC

## 2020-02-25 MED ORDER — OXYCODONE HCL 5 MG PO TABS: 5.0000 mg | ORAL_TABLET | ORAL | 0 refills | Status: DC | PRN

## 2020-02-25 MED ORDER — ONDANSETRON HCL 4 MG/2ML IJ SOLN
INTRAMUSCULAR | Status: AC
  Filled 2020-02-25: qty 2

## 2020-02-25 MED ORDER — BUPIVACAINE HCL (PF) 0.5 % IJ SOLN
10.0000 mL | Freq: Once | INTRAMUSCULAR | Status: AC
  Filled 2020-02-25: qty 10

## 2020-02-25 MED ORDER — SODIUM CHLORIDE 0.9 % IV SOLN: INTRAVENOUS | Status: DC | PRN

## 2020-02-25 MED ORDER — BUPIVACAINE HCL (PF) 0.25 % IJ SOLN
INTRAMUSCULAR | Status: AC
  Filled 2020-02-25: qty 30

## 2020-02-25 MED ORDER — FENTANYL CITRATE (PF) 100 MCG/2ML IJ SOLN
INTRAMUSCULAR | Status: AC
  Filled 2020-02-25: qty 2

## 2020-02-25 MED ORDER — ONDANSETRON HCL 4 MG/2ML IJ SOLN
INTRAMUSCULAR | Status: DC | PRN
  Administered 2020-02-25: 4 mg via INTRAVENOUS

## 2020-02-25 MED ORDER — CEPHALEXIN 500 MG PO CAPS: 500.0000 mg | ORAL_CAPSULE | Freq: Four times a day (QID) | ORAL | 0 refills | Status: AC

## 2020-02-25 MED ORDER — FENTANYL CITRATE (PF) 100 MCG/2ML IJ SOLN
INTRAMUSCULAR | Status: DC | PRN
Start: 2020-02-25 — End: 2020-02-25
  Administered 2020-02-25: 50 ug via INTRAVENOUS

## 2020-02-25 MED ORDER — CHLORHEXIDINE GLUCONATE 0.12 % MT SOLN
OROMUCOSAL | Status: AC
  Administered 2020-02-25: 15 mL
  Filled 2020-02-25: qty 15

## 2020-02-25 MED ORDER — POVIDONE-IODINE 10 % EX SWAB
2.0000 "application " | Freq: Once | CUTANEOUS | Status: AC
  Administered 2020-02-25: 2 via TOPICAL

## 2020-02-25 MED ORDER — OXYCODONE-ACETAMINOPHEN 5-325 MG PO TABS
1.0000 | ORAL_TABLET | Freq: Once | ORAL | Status: AC
  Administered 2020-02-25: 1 via ORAL
  Filled 2020-02-25: qty 1

## 2020-02-25 SURGICAL SUPPLY — 56 items
BNDG COHESIVE 2X5 TAN STRL LF (GAUZE/BANDAGES/DRESSINGS) ×3 IMPLANT
BNDG CONFORM 2 STRL LF (GAUZE/BANDAGES/DRESSINGS) ×3 IMPLANT
BNDG ELASTIC 3X5.8 VLCR NS LF (GAUZE/BANDAGES/DRESSINGS) IMPLANT
BNDG ELASTIC 3X5.8 VLCR STR LF (GAUZE/BANDAGES/DRESSINGS) ×3 IMPLANT
BNDG ELASTIC 4X5.8 VLCR STR LF (GAUZE/BANDAGES/DRESSINGS) IMPLANT
BNDG ESMARK 4X9 LF (GAUZE/BANDAGES/DRESSINGS) ×3 IMPLANT
BNDG GAUZE ELAST 4 BULKY (GAUZE/BANDAGES/DRESSINGS) IMPLANT
CORD BIPOLAR FORCEPS 12FT (ELECTRODE) ×3 IMPLANT
COVER SURGICAL LIGHT HANDLE (MISCELLANEOUS) ×3 IMPLANT
COVER WAND RF STERILE (DRAPES) IMPLANT
CUFF TOURN SGL QUICK 18X4 (TOURNIQUET CUFF) ×3 IMPLANT
CUFF TOURN SGL QUICK 24 (TOURNIQUET CUFF)
CUFF TRNQT CYL 24X4X16.5-23 (TOURNIQUET CUFF) IMPLANT
DRAPE OEC MINIVIEW 54X84 (DRAPES) ×3 IMPLANT
DRAPE SURG 17X23 STRL (DRAPES) ×3 IMPLANT
DRSG ADAPTIC 3X8 NADH LF (GAUZE/BANDAGES/DRESSINGS) ×3 IMPLANT
DRSG EMULSION OIL 3X3 NADH (GAUZE/BANDAGES/DRESSINGS) ×3 IMPLANT
DRSG XEROFORM 1X8 (GAUZE/BANDAGES/DRESSINGS) ×3 IMPLANT
GAUZE SPONGE 4X4 12PLY STRL (GAUZE/BANDAGES/DRESSINGS) ×3 IMPLANT
GAUZE XEROFORM 1X8 LF (GAUZE/BANDAGES/DRESSINGS) ×3 IMPLANT
GLOVE BIOGEL M 8.0 STRL (GLOVE) ×3 IMPLANT
GLOVE SS BIOGEL STRL SZ 8 (GLOVE) ×1 IMPLANT
GLOVE SUPERSENSE BIOGEL SZ 8 (GLOVE) ×2
GOWN STRL REUS W/ TWL LRG LVL3 (GOWN DISPOSABLE) ×1 IMPLANT
GOWN STRL REUS W/ TWL XL LVL3 (GOWN DISPOSABLE) ×2 IMPLANT
GOWN STRL REUS W/TWL LRG LVL3 (GOWN DISPOSABLE) ×2
GOWN STRL REUS W/TWL XL LVL3 (GOWN DISPOSABLE) ×4
JET LAVAGE IRRISEPT WOUND (IRRIGATION / IRRIGATOR) ×3
K-WIRE 0.9X102 (Wire) ×3 IMPLANT
KIT BASIN OR (CUSTOM PROCEDURE TRAY) ×3 IMPLANT
KIT TURNOVER KIT B (KITS) ×3 IMPLANT
KWIRE 0.9X102 (Wire) ×1 IMPLANT
LAVAGE JET IRRISEPT WOUND (IRRIGATION / IRRIGATOR) ×1 IMPLANT
MANIFOLD NEPTUNE II (INSTRUMENTS) ×3 IMPLANT
NEEDLE 22X1 1/2 (OR ONLY) (NEEDLE) IMPLANT
NEEDLE HYPO 25GX1X1/2 BEV (NEEDLE) ×3 IMPLANT
NS IRRIG 1000ML POUR BTL (IV SOLUTION) ×3 IMPLANT
PACK ORTHO EXTREMITY (CUSTOM PROCEDURE TRAY) ×3 IMPLANT
PAD ARMBOARD 7.5X6 YLW CONV (MISCELLANEOUS) ×6 IMPLANT
PAD CAST 3X4 CTTN HI CHSV (CAST SUPPLIES) IMPLANT
PAD CAST 4YDX4 CTTN HI CHSV (CAST SUPPLIES) IMPLANT
PADDING CAST COTTON 3X4 STRL (CAST SUPPLIES)
PADDING CAST COTTON 4X4 STRL (CAST SUPPLIES)
SET CYSTO W/LG BORE CLAMP LF (SET/KITS/TRAYS/PACK) ×3 IMPLANT
SOL PREP POV-IOD 4OZ 10% (MISCELLANEOUS) ×6 IMPLANT
SPLINT FINGER 6.25 W/BULB ALUM (SOFTGOODS) ×3 IMPLANT
SPONGE LAP 4X18 RFD (DISPOSABLE) IMPLANT
SUT CHROMIC 5 0 P 3 (SUTURE) ×3 IMPLANT
SWAB CULTURE ESWAB REG 1ML (MISCELLANEOUS) IMPLANT
SYR CONTROL 10ML LL (SYRINGE) ×3 IMPLANT
TOWEL GREEN STERILE (TOWEL DISPOSABLE) ×3 IMPLANT
TOWEL GREEN STERILE FF (TOWEL DISPOSABLE) ×3 IMPLANT
TUBE CONNECTING 12'X1/4 (SUCTIONS) ×1
TUBE CONNECTING 12X1/4 (SUCTIONS) ×2 IMPLANT
WATER STERILE IRR 1000ML POUR (IV SOLUTION) ×3 IMPLANT
YANKAUER SUCT BULB TIP NO VENT (SUCTIONS) ×3 IMPLANT

## 2020-02-25 NOTE — ED Notes (Signed)
Pt endorses having a ride

## 2020-02-25 NOTE — Anesthesia Procedure Notes (Signed)
Procedure Name: LMA Insertion Date/Time: 02/25/2020 5:13 PM Performed by: Laruth Bouchard., CRNA Pre-anesthesia Checklist: Patient identified, Emergency Drugs available, Suction available, Patient being monitored and Timeout performed Patient Re-evaluated:Patient Re-evaluated prior to induction Oxygen Delivery Method: Circle system utilized Preoxygenation: Pre-oxygenation with 100% oxygen Induction Type: IV induction LMA: LMA inserted LMA Size: 4.0 Number of attempts: 1 Placement Confirmation: positive ETCO2 and breath sounds checked- equal and bilateral Tube secured with: Tape Dental Injury: Teeth and Oropharynx as per pre-operative assessment

## 2020-02-25 NOTE — H&P (Signed)
Please see full consultation report.  We will plan for surgical reconstruction about the patient's affected finger.  We have gone over all issues plans concerns and other aspects of the care.  She understands the issues of an open fracture and the necessary surgical intervention proposed. We are planning surgery for your upper extremity. The risk and benefits of surgery to include risk of bleeding, infection, anesthesia,  damage to normal structures and failure of the surgery to accomplish its intended goals of relieving symptoms and restoring function have been discussed in detail. With this in mind we plan to proceed. I have specifically discussed with the patient the pre-and postoperative regime and the dos and don'ts and risk and benefits in great detail. Risk and benefits of surgery also include risk of dystrophy(CRPS), chronic nerve pain, failure of the healing process to go onto completion and other inherent risks of surgery The relavent the pathophysiology of the disease/injury process, as well as the alternatives for treatment and postoperative course of action has been discussed in great detail with the patient who desires to proceed.  We will do everything in our power to help you (the patient) restore function to the upper extremity. It is a pleasure to see this patient today.  Avanna Sowder MD

## 2020-02-25 NOTE — Anesthesia Preprocedure Evaluation (Addendum)
Anesthesia Evaluation  Patient identified by MRN, date of birth, ID band Patient awake    Reviewed: Allergy & Precautions, NPO status , Patient's Chart, lab work & pertinent test results  Airway Mallampati: II  TM Distance: >3 FB Neck ROM: Full    Dental  (+) Dental Advisory Given   Pulmonary Current Smoker and Patient abstained from smoking.,    Pulmonary exam normal        Cardiovascular negative cardio ROS Normal cardiovascular exam     Neuro/Psych negative neurological ROS     GI/Hepatic negative GI ROS, Neg liver ROS,   Endo/Other  negative endocrine ROS  Renal/GU negative Renal ROS     Musculoskeletal   Abdominal   Peds  Hematology negative hematology ROS (+)   Anesthesia Other Findings   Reproductive/Obstetrics                            Anesthesia Physical Anesthesia Plan  ASA: II  Anesthesia Plan: General   Post-op Pain Management:    Induction: Intravenous  PONV Risk Score and Plan: 2 and Ondansetron, Dexamethasone and Treatment may vary due to age or medical condition  Airway Management Planned: LMA  Additional Equipment: None  Intra-op Plan:   Post-operative Plan: Extubation in OR  Informed Consent: I have reviewed the patients History and Physical, chart, labs and discussed the procedure including the risks, benefits and alternatives for the proposed anesthesia with the patient or authorized representative who has indicated his/her understanding and acceptance.     Dental advisory given  Plan Discussed with: CRNA and Anesthesiologist  Anesthesia Plan Comments:        Anesthesia Quick Evaluation

## 2020-02-25 NOTE — ED Notes (Signed)
ED Provider at bedside. 

## 2020-02-25 NOTE — ED Provider Notes (Signed)
[facility] EMERGENCY DEPARTMENT Provider Note   CSN: [id number] Arrival date & time: 02/25/20  4315     History Chief Complaint  Patient presents with  . Finger Injury    Laurie Key is a 34 y.o. female.  The history is provided by the patient and medical records. No language interpreter was used.  Hand Pain This is a new problem. The current episode started 1 to 2 hours ago. The problem occurs rarely. The problem has not changed since onset.Pertinent negatives include no chest pain, no abdominal pain, no headaches and no shortness of breath. Nothing aggravates the symptoms. Nothing relieves the symptoms. She has tried nothing for the symptoms. The treatment provided no relief.       History reviewed. No pertinent past medical history.  There are no problems to display for this patient.   History reviewed. No pertinent surgical history.   OB History   No obstetric history on file.     No family history on file.  Social History   Tobacco Use  . Smoking status: Current Every Day Smoker    Packs/day: 0.50  . Smokeless tobacco: Never Used  Substance Use Topics  . Alcohol use: No  . Drug use: No    Home Medications Prior to Admission medications   Medication Sig Start Date End Date Taking? Authorizing Provider  metoCLOPramide (REGLAN) 10 MG tablet Take 1 tablet (10 mg total) by mouth every 6 (six) hours as needed (nausea). 01/26/13   Gerhard Munch, MD  ondansetron (ZOFRAN ODT) 8 MG disintegrating tablet Take 1 tablet (8 mg total) by mouth every 8 (eight) hours as needed for nausea or vomiting. 07/11/13   Margarita Grizzle, MD  traMADol (ULTRAM) 50 MG tablet Take 1 tablet (50 mg total) by mouth every 6 (six) hours as needed for pain. 01/26/13   Gerhard Munch, MD    Allergies    Patient has no known allergies.  Review of Systems   Review of Systems  Constitutional: Negative for chills and fatigue.  HENT: Negative for congestion.   Respiratory:  Negative for chest tightness and shortness of breath.   Cardiovascular: Negative for chest pain.  Gastrointestinal: Negative for abdominal pain.  Genitourinary: Negative for flank pain.  Musculoskeletal: Negative for back pain.  Skin: Positive for wound.  Neurological: Negative for headaches.  Psychiatric/Behavioral: Negative for agitation.  All other systems reviewed and are negative.   Physical Exam Updated Vital Signs BP 110/79 (BP Location: Right Arm)   Pulse 97   Temp 98.3 F (36.8 C) (Oral)   Resp 18   Ht 5\' 8"  (1.727 m)   Wt 85.7 kg   LMP 02/18/2020   SpO2 100%   BMI 28.73 kg/m   Physical Exam Vitals and nursing note reviewed.  Constitutional:      General: She is not in acute distress.    Appearance: She is well-developed. She is not ill-appearing or toxic-appearing.  HENT:     Head: Normocephalic and atraumatic.  Eyes:     Conjunctiva/sclera: Conjunctivae normal.  Cardiovascular:     Rate and Rhythm: Normal rate and regular rhythm.     Heart sounds: No murmur heard.   Pulmonary:     Effort: Pulmonary effort is normal. No respiratory distress.     Breath sounds: Normal breath sounds. No wheezing, rhonchi or rales.  Chest:     Chest wall: No tenderness.  Abdominal:     Palpations: Abdomen is soft.     Tenderness: There  is no abdominal tenderness.  Musculoskeletal:        General: Tenderness present.     Cervical back: Neck supple.  Skin:    General: Skin is warm and dry.     Capillary Refill: Capillary refill takes less than 2 seconds.  Neurological:     General: No focal deficit present.     Mental Status: She is alert.     Sensory: No sensory deficit.     Motor: No weakness.  Psychiatric:        Mood and Affect: Mood normal.     ED Results / Procedures / Treatments   Labs (all labs ordered are listed, but only abnormal results are displayed) Labs Reviewed  RESPIRATORY PANEL BY RT PCR (FLU A&B, COVID)  SURGICAL PCR SCREEN  CBC  HCG, SERUM,  QUALITATIVE    EKG None  Radiology DG Finger Ring Left  Result Date: 02/25/2020 CLINICAL DATA:  Ring finger crush injury in door. EXAM: LEFT RING FINGER 2+V COMPARISON:  None. FINDINGS: Comminuted transverse fracture through the proximal aspect of the ring finger distal phalanx noted with apex posterior angulation. Component of the fracture extends to the proximal articular surface. IMPRESSION: Comminuted transverse fracture noted proximally in the ring finger distal phalanx with apex posterior angulation. Fracture line extends to the adjacent articular surface. Electronically Signed   By: Kennith Center M.D.   On: 02/25/2020 07:28    Procedures Procedures (including critical care time)  Medications Ordered in ED Medications  chlorhexidine (HIBICLENS) 4 % liquid 4 application (has no administration in time range)  ceFAZolin (ANCEF) IVPB 2g/100 mL premix (has no administration in time range)  0.9 %  sodium chloride infusion ( Intravenous MAR Hold 02/25/20 1236)  bupivacaine (MARCAINE) 0.5 % injection 10 mL (has no administration in time range)  oxyCODONE-acetaminophen (PERCOCET/ROXICET) 5-325 MG per tablet 1 tablet (1 tablet Oral Given 02/25/20 0800)  ceFAZolin (ANCEF) IVPB 2g/100 mL premix (0 g Intravenous Stopped 02/25/20 1128)  morphine 4 MG/ML injection 4 mg (4 mg Intravenous Given 02/25/20 1016)  povidone-iodine 10 % swab 2 application (2 application Topical Given 02/25/20 1504)  chlorhexidine (PERIDEX) 0.12 % solution (15 mLs  Given 02/25/20 1431)  celecoxib (CELEBREX) capsule 200 mg (200 mg Oral Given 02/25/20 1504)  acetaminophen (TYLENOL) tablet 1,000 mg (1,000 mg Oral Given 02/25/20 1504)    ED Course  I have reviewed the triage vital signs and the nursing notes.  Pertinent labs & imaging results that were available during my care of the patient were reviewed by me and considered in my medical decision making (see chart for details).    MDM Rules/Calculators/A&P                           Laurie Key is a 34 y.o. right handed female with no significant past medical history who presents with finger injury.  Patient reports that she was closing her bathroom door this morning when she slammed her left ring finger into the door.  She has artificial nails on top of her regular nails.  She reports immediate onset of pain and bleeding.  She presents for evaluation.  She denies any other injuries or any other preceding complaints.  She denies any fevers, chills, congestion, cough, chest pain, shortness breath, or other Covid symptoms.  She describes the pain is severe.  She denies pain in her wrist, snuffbox, or rest of her hand aside from the fourth distal finger.  On exam, patient has injury to the proximal nail fold area with some small avulsion.  She has intact capillary refill and sensation distal to the injury.  She has false nails in place.  She has normal range of motion of the fingers when I bend them and she has pain limiting her other finger movement.  Good capillary refill and sensation otherwise.  Good pulses.  No snuffbox tenderness.  Lungs clear and chest and back nontender.  Patient resting comfortably.  We will get x-rays to look for bony fracture.  After imaging, dissipate cleaning and evaluation.  Patient reports she is scared of needles and is interested in avoiding sutures or digital block if possible.  Anticipate reassessment after imaging.  9:34 AM X-ray shows comminuted transverse fracture of the distal phalanx.  Spoke with hand surgery who will take the patient to the OR today for washout and repair.  They recommended she remain n.p.o. and have a Covid test which was ordered.  She reports her tetanus is up-to-date.  Wound will be washed and dressed prior to transfer.  Will ask if hand surgery would like antibiotics or not.  Covid test ordered.  9:45 AM Hand orthopedic surgery requested she get 2 g of Ancef through IV before being transferred.  We will also  give her some IV pain medicine given the continued pain.  After antibiotics are in, we will transfer with POV to admitting area of OR for surgery this afternoon.  Patient will be transferred to the admitting area for the [facility] OR for further management.   Final Clinical Impression(s) / ED Diagnoses Final diagnoses:  Crushing injury of finger, initial encounter  Open displaced fracture of distal phalanx of left ring finger, initial encounter     Clinical Impression: 1. Crushing injury of finger, initial encounter   2. Open displaced fracture of distal phalanx of left ring finger, initial encounter     Disposition: Transfer to Dr. Amanda Key with hand surgery at [facility] to the admissions area to go to the OR for surgical management of open fracture today.  This note was prepared with assistance of Conservation officer, historic buildings. Occasional wrong-word or sound-a-like substitutions may have occurred due to the inherent limitations of voice recognition software.     Adell Koval, Canary Brim, MD 02/25/20 [phone]

## 2020-02-25 NOTE — ED Triage Notes (Signed)
Patient presents with complaints of injury to left 4th digit; states closed finger into the door this am just pta.

## 2020-02-25 NOTE — Op Note (Signed)
Operative note 02/25/2020  Dominica Severin  Preoperative diagnosis left ring finger open distal phalanx fracture with nailbed disarray and soft tissue injury.  This is an open fracture.  Postop diagnosis the same.  Operative procedure #1 irrigation debridement open fracture distal phalanx left ring finger #2 nail plate removal  left  ring finger #3 ORIF left ring finger distal phalanx fracture open in nature with Kirschner wire #4 nailbed repair left ring finger  Surgeon Dominica Severin  Anesthesia General  Tourniquet time less than an hour.  Description of procedure: Patient was seen by myself and anesthesia we counseled her extensively in regards to the risk and benefits of surgery including risk of infection bleeding anesthesia damage to normal structures and failure of the surgery to conscious intended goals of leaving symptoms and restoring function.  With this in mind she desires to proceed.  In the operative theater she was given a general anesthetic.  I performed to Hibiclens scrubs followed by 10-minute surgical Betadine scrub and paint.  This was performed by myself.  Sterile field was secured and timeout was observed.  Following this the patient then underwent a very careful and cautious irrigation to process.  Following this I then performed nail plate removal with freer elevator.  She has very thick artificial nails was made this difficult but we were able to preserve the distal portion of her nailbed nicely.  Following this I made incisions about the lateral folds and turned back to skin tissue.  This allowed access to the open fracture.  I then performed irrigation and debridement of skin subtenons tissue bone and deep tissue with curette knife and scissor.  Following this 3 L of saline were placed through and through the area.  Following this we then placed a bottle of Aricept solution and the wound.  Following this I performed reduction followed by placement of a 0.035 Kirschner  wire down the intramedullary shaft of the distal and middle phalanx for ORIF purposes.  This will be removed later at 6 weeks or so depending on her healing.  The pin was pre-bent and seated well against the pulp tissue.  Following this we performed nailbed repair.  This was performed without difficulty.  Following this I then irrigated additionally and then repaired the lateral nail fold with chromic suture.  Following this we then placed her in a splint and I placed 7 cc of Sensorcaine in the wound for postop analgesia.  The patient tolerated this well there are no complicating features.  She had good refill and no complications.  She will be discharged home on Keflex x10 days and oxycodone as needed pain she will notify me same problems occur.  Kacy Conely MD

## 2020-02-25 NOTE — ED Notes (Signed)
Ice pack given to patient.

## 2020-02-25 NOTE — Discharge Instructions (Signed)
You had an open fracture about your hand.  Please make sure to take the antibiotics for 14 days.  If the antibiotics have side effects please call the office of Dr. Amanda Pea.  We have also prescribed pain medicine for you.  Please take this as directed.  Elevation is very important to prevent pain and swelling.  Your finger will be numb tonight due to the medicine that we placed in the finger to decrease the pain the first 12 hours.  Thereafter the finger should wake up and you should experience difficulty with pain thus the pain medicine.  Please call for any problems.  Please live a healthy lifestyle.  You will be out of work until further notice.  Please call the office Monday if you need a more formalized work note.

## 2020-02-25 NOTE — Transfer of Care (Signed)
Immediate Anesthesia Transfer of Care Note  Patient: Laurie Key  Procedure(s) Performed: IRRIGATION AND DEBRIDEMENT EXTREMITY (Left Finger) CLOSED REDUCTION FINGER WITH PERCUTANEOUS PINNING (Left Finger)  Patient Location: PACU  Anesthesia Type:General  Level of Consciousness: awake, alert  and oriented  Airway & Oxygen Therapy: Patient Spontanous Breathing and Patient connected to nasal cannula oxygen  Post-op Assessment: Report given to RN and Post -op Vital signs reviewed and stable  Post vital signs: Reviewed and stable  Last Vitals:  Vitals Value Taken Time  BP 119/85 02/25/20 1807  Temp    Pulse 71 02/25/20 1808  Resp 18 02/25/20 1808  SpO2 98 % 02/25/20 1808  Vitals shown include unvalidated device data.  Last Pain:  Vitals:   02/25/20 1807  TempSrc:   PainSc: 0-No pain      Patients Stated Pain Goal: 2 (02/25/20 1425)  Complications: No complications documented.

## 2020-02-25 NOTE — ED Notes (Addendum)
Pt verbalizes understanding of how to get to short stay at [facility] and to go there from the ER. Pt acknowledges need to eat nothing after leaving ER and to remain NPO. Pt ambulatory to lobby with steady gait in NAD. VSS. Pt to ride with Aunt to short stay.

## 2020-02-25 NOTE — Consult Note (Signed)
Reason for Consult:Left ring finger open fx Referring Physician: C Tegeler  Laurie Key is an 34 y.o. female.  HPI: Laurie Key was getting into her car this morning and accidentally slammed her ring finger in the door. She had immediate pain and it was bleeding. She went to [facility] where x-rays showed a distal phalanx fx and hand surgery was consulted. She was given abx prophylaxis and sent to [facility] for definitive treatment. She is RHD and works Programmer, systems pumps.  History reviewed. No pertinent past medical history.  History reviewed. No pertinent surgical history.  No family history on file.  Social History:  reports that she has been smoking. She has been smoking about 0.50 packs per day. She has never used smokeless tobacco. She reports that she does not drink alcohol and does not use drugs.  Allergies: No Known Allergies  Medications: I have reviewed the patient's current medications.  Results for orders placed or performed during the hospital encounter of 02/25/20 (from the past 48 hour(s))  Respiratory Panel by RT PCR (Flu A&B, Covid) - Nasopharyngeal Swab     Status: None   Collection Time: 02/25/20 10:22 AM   Specimen: Nasopharyngeal Swab  Result Value Ref Range   SARS Coronavirus 2 by RT PCR NEGATIVE NEGATIVE    Comment: (NOTE) SARS-CoV-2 target nucleic acids are NOT DETECTED.  The SARS-CoV-2 RNA is generally detectable in upper respiratoy specimens during the acute phase of infection. The lowest concentration of SARS-CoV-2 viral copies this assay can detect is 131 copies/mL. A negative result does not preclude SARS-Cov-2 infection and should not be used as the sole basis for treatment or other patient management decisions. A negative result may occur with  improper specimen collection/handling, submission of specimen other than nasopharyngeal swab, presence of viral mutation(s) within the areas targeted by this assay, and inadequate number of viral copies (<131 copies/mL).  A negative result must be combined with clinical observations, patient history, and epidemiological information. The expected result is Negative.  Fact Sheet for Patients:  https://www.moore.com/  Fact Sheet for Healthcare Providers:  https://www.young.biz/  This test is no t yet approved or cleared by the [place] FDA and  has been authorized for detection and/or diagnosis of SARS-CoV-2 by FDA under an Emergency Use Authorization (EUA). This EUA will remain  in effect (meaning this test can be used) for the duration of the COVID-19 declaration under Section 564(b)(1) of the Act, 21 U.S.C. section 360bbb-3(b)(1), unless the authorization is terminated or revoked sooner.     Influenza A by PCR NEGATIVE NEGATIVE   Influenza B by PCR NEGATIVE NEGATIVE    Comment: (NOTE) The Xpert Xpress SARS-CoV-2/FLU/RSV assay is intended as an aid in  the diagnosis of influenza from Nasopharyngeal swab specimens and  should not be used as a sole basis for treatment. Nasal washings and  aspirates are unacceptable for Xpert Xpress SARS-CoV-2/FLU/RSV  testing.  Fact Sheet for Patients: https://www.moore.com/  Fact Sheet for Healthcare Providers: https://www.young.biz/  This test is not yet approved or cleared by the [place] FDA and  has been authorized for detection and/or diagnosis of SARS-CoV-2 by  FDA under an Emergency Use Authorization (EUA). This EUA will remain  in effect (meaning this test can be used) for the duration of the  Covid-19 declaration under Section 564(b)(1) of the Act, 21  U.S.C. section 360bbb-3(b)(1), unless the authorization is  terminated or revoked. Performed at [facility], [address] [place], [place] [address]  DG Finger Ring Left  Result Date: 02/25/2020 CLINICAL DATA:  Ring finger crush injury in door. EXAM: LEFT RING FINGER 2+V COMPARISON:  None.  FINDINGS: Comminuted transverse fracture through the proximal aspect of the ring finger distal phalanx noted with apex posterior angulation. Component of the fracture extends to the proximal articular surface. IMPRESSION: Comminuted transverse fracture noted proximally in the ring finger distal phalanx with apex posterior angulation. Fracture line extends to the adjacent articular surface. Electronically Signed   By: Kennith Center M.D.   On: 02/25/2020 07:28    Review of Systems  HENT: Negative for ear discharge, ear pain, hearing loss and tinnitus.   Eyes: Negative for photophobia and pain.  Respiratory: Negative for cough and shortness of breath.   Cardiovascular: Negative for chest pain.  Gastrointestinal: Negative for abdominal pain, nausea and vomiting.  Genitourinary: Negative for dysuria, flank pain, frequency and urgency.  Musculoskeletal: Positive for arthralgias (Left ring finger). Negative for back pain, myalgias and neck pain.  Neurological: Negative for dizziness and headaches.  Hematological: Does not bruise/bleed easily.  Psychiatric/Behavioral: The patient is not nervous/anxious.    Blood pressure 121/77, pulse 77, temperature 98.5 F (36.9 C), temperature source Oral, resp. rate 16, height 5\' 8"  (1.727 m), weight 85.7 kg, last menstrual period 02/18/2020, SpO2 100 %. Physical Exam Constitutional:      General: She is not in acute distress.    Appearance: She is well-developed. She is not diaphoretic.  HENT:     Head: Normocephalic and atraumatic.  Eyes:     General: No scleral icterus.       Right eye: No discharge.        Left eye: No discharge.     Conjunctiva/sclera: Conjunctivae normal.  Cardiovascular:     Rate and Rhythm: Normal rate and regular rhythm.  Pulmonary:     Effort: Pulmonary effort is normal. No respiratory distress.  Musculoskeletal:     Cervical back: Normal range of motion.     Comments: Left shoulder, elbow, wrist, digits- Transverse dorsal lac  prox to nail ring finger, severe TTP, no instability, no blocks to motion  Sens  Ax/R/M/U intact  Mot   Ax/ R/ PIN/ M/ AIN/ U intact  Rad 2+  Skin:    General: Skin is warm and dry.  Neurological:     Mental Status: She is alert.  Psychiatric:        Behavior: Behavior normal.     Assessment/Plan: Left ring finger open distal phalanx fx -- Plan I&D, CRPP today by Dr. 04/19/2020. Please keep NPO. Anticipate discharge after surgery. Tobacco use     Amanda Pea, PA-C Orthopedic Surgery [phone] 02/25/2020, 12:52 PM

## 2020-02-25 NOTE — ED Notes (Signed)
Report called to Porfirio Oar RN at short stay

## 2020-02-26 NOTE — Anesthesia Postprocedure Evaluation (Signed)
Anesthesia Post Note  Patient: Laurie Key  Procedure(s) Performed: IRRIGATION AND DEBRIDEMENT EXTREMITY (Left Finger) CLOSED REDUCTION FINGER WITH PERCUTANEOUS PINNING (Left Finger)     Patient location during evaluation: PACU Anesthesia Type: General Level of consciousness: awake and alert Pain management: pain level controlled Vital Signs Assessment: post-procedure vital signs reviewed and stable Respiratory status: spontaneous breathing, nonlabored ventilation and respiratory function stable Cardiovascular status: blood pressure returned to baseline and stable Postop Assessment: no apparent nausea or vomiting Anesthetic complications: no   No complications documented.  Last Vitals:  Vitals:   02/25/20 1821 02/25/20 1836  BP: 121/83 112/83  Pulse: (!) 58 69  Resp: 16 17  Temp:  36.6 C  SpO2: 97% 98%    Last Pain:  Vitals:   02/25/20 1836  TempSrc:   PainSc: 0-No pain                 Beryle Lathe

## 2020-02-28 ENCOUNTER — Encounter (HOSPITAL_COMMUNITY): Payer: Self-pay | Admitting: Orthopedic Surgery

## 2020-08-25 ENCOUNTER — Emergency Department (HOSPITAL_COMMUNITY)
Admission: EM | Admit: 2020-08-25 | Discharge: 2020-08-25 | Disposition: A | Discharge status: Home / Self Care | Payer: Medicaid Other | Attending: Emergency Medicine | Admitting: Emergency Medicine

## 2020-08-25 ENCOUNTER — Emergency Department (HOSPITAL_COMMUNITY): Payer: Medicaid Other

## 2020-08-25 ENCOUNTER — Encounter (HOSPITAL_COMMUNITY): Payer: Self-pay | Admitting: Emergency Medicine

## 2020-08-25 ENCOUNTER — Other Ambulatory Visit: Payer: Self-pay

## 2020-08-25 DIAGNOSIS — M791 Myalgia, unspecified site: Secondary | ICD-10-CM | POA: Diagnosis not present

## 2020-08-25 DIAGNOSIS — Z28310 Unvaccinated for covid-19: Secondary | ICD-10-CM | POA: Diagnosis not present

## 2020-08-25 DIAGNOSIS — F17200 Nicotine dependence, unspecified, uncomplicated: Secondary | ICD-10-CM | POA: Insufficient documentation

## 2020-08-25 DIAGNOSIS — R5383 Other fatigue: Secondary | ICD-10-CM | POA: Insufficient documentation

## 2020-08-25 DIAGNOSIS — Z20822 Contact with and (suspected) exposure to covid-19: Secondary | ICD-10-CM | POA: Insufficient documentation

## 2020-08-25 DIAGNOSIS — R112 Nausea with vomiting, unspecified: Secondary | ICD-10-CM | POA: Insufficient documentation

## 2020-08-25 LAB — COMPREHENSIVE METABOLIC PANEL
ALT: 12 U/L (ref 0–44)
AST: 24 U/L (ref 15–41)
Albumin: 4.2 g/dL (ref 3.5–5.0)
Alkaline Phosphatase: 53 U/L (ref 38–126)
Anion gap: 11 (ref 5–15)
BUN: <5 mg/dL — ABNORMAL LOW (ref 6–20)
CO2: 22 mmol/L (ref 22–32)
Calcium: 9.6 mg/dL (ref 8.9–10.3)
Chloride: 105 mmol/L (ref 98–111)
Creatinine, Ser: 0.63 mg/dL (ref 0.44–1.00)
GFR, Estimated: >60 mL/min (ref >60)
Glucose, Bld: 127 mg/dL — ABNORMAL HIGH (ref 70–99)
Potassium: 3.3 mmol/L — ABNORMAL LOW (ref 3.5–5.1)
Sodium: 138 mmol/L (ref 135–145)
Total Bilirubin: 0.6 mg/dL (ref 0.3–1.2)
Total Protein: 7.8 g/dL (ref 6.5–8.1)

## 2020-08-25 LAB — CBC WITH DIFFERENTIAL/PLATELET
Abs Immature Granulocytes: 0.01 10*3/uL (ref 0.00–0.07)
Basophils Absolute: 0 10*3/uL (ref 0.0–0.1)
Basophils Relative: 0 %
Eosinophils Absolute: 0 10*3/uL (ref 0.0–0.5)
Eosinophils Relative: 0 %
HCT: 40.2 % (ref 36.0–46.0)
Hemoglobin: 13.6 g/dL (ref 12.0–15.0)
Immature Granulocytes: 0 %
Lymphocytes Relative: 11 %
Lymphs Abs: 1 10*3/uL (ref 0.7–4.0)
MCH: 33.5 pg (ref 26.0–34.0)
MCHC: 33.8 g/dL (ref 30.0–36.0)
MCV: 99 fL (ref 80.0–100.0)
Monocytes Absolute: 0.4 10*3/uL (ref 0.1–1.0)
Monocytes Relative: 4 %
Neutro Abs: 8.1 10*3/uL — ABNORMAL HIGH (ref 1.7–7.7)
Neutrophils Relative %: 85 %
Platelets: 259 10*3/uL (ref 150–400)
RBC: 4.06 MIL/uL (ref 3.87–5.11)
RDW: 12.4 % (ref 11.5–15.5)
WBC: 9.6 10*3/uL (ref 4.0–10.5)
nRBC: 0 % (ref 0.0–0.2)

## 2020-08-25 LAB — URINALYSIS, ROUTINE W REFLEX MICROSCOPIC
Bacteria, UA: NONE SEEN
Bilirubin Urine: NEGATIVE
Glucose, UA: NEGATIVE mg/dL
Hgb urine dipstick: NEGATIVE
Ketones, ur: 80 mg/dL — AB
Leukocytes,Ua: NEGATIVE
Nitrite: NEGATIVE
Protein, ur: 100 mg/dL — AB
Specific Gravity, Urine: 1.016 (ref 1.005–1.030)
pH: 9 — ABNORMAL HIGH (ref 5.0–8.0)

## 2020-08-25 LAB — RESP PANEL BY RT-PCR (FLU A&B, COVID) ARPGX2
Influenza A by PCR: NEGATIVE
Influenza B by PCR: NEGATIVE
SARS Coronavirus 2 by RT PCR: NEGATIVE

## 2020-08-25 LAB — I-STAT BETA HCG BLOOD, ED (MC, WL, AP ONLY): I-stat hCG, quantitative: <5 m[IU]/mL (ref <5)

## 2020-08-25 LAB — LIPASE, BLOOD: Lipase: 27 U/L (ref 11–51)

## 2020-08-25 MED ORDER — LORAZEPAM 2 MG/ML IJ SOLN
1.0000 mg | Freq: Once | INTRAMUSCULAR | Status: AC
  Administered 2020-08-25: 1 mg via INTRAVENOUS
  Filled 2020-08-25: qty 1

## 2020-08-25 MED ORDER — ONDANSETRON HCL 4 MG/2ML IJ SOLN
4.0000 mg | Freq: Once | INTRAMUSCULAR | Status: AC
  Administered 2020-08-25: 4 mg via INTRAVENOUS
  Filled 2020-08-25: qty 2

## 2020-08-25 MED ORDER — KETOROLAC TROMETHAMINE 30 MG/ML IJ SOLN
30.0000 mg | Freq: Once | INTRAMUSCULAR | Status: AC
  Administered 2020-08-25: 30 mg via INTRAVENOUS
  Filled 2020-08-25: qty 1

## 2020-08-25 MED ORDER — ONDANSETRON 4 MG PO TBDP: 4.0000 mg | ORAL_TABLET | Freq: Three times a day (TID) | ORAL | 0 refills | Status: AC | PRN

## 2020-08-25 MED ORDER — HALOPERIDOL LACTATE 5 MG/ML IJ SOLN
1.0000 mg | Freq: Once | INTRAMUSCULAR | Status: AC
  Administered 2020-08-25: 1 mg via INTRAVENOUS
  Filled 2020-08-25: qty 1

## 2020-08-25 NOTE — ED Triage Notes (Signed)
Emergency Medicine Provider Triage Evaluation Note  Laurie Key , a 35 y.o. female  was evaluated in triage.  Pt complains of  Pt complains of  Nausea, vomiting, and abdominal pain.  Vomited 5 times, bilious emesis.  Last Bowel movement this am, no diarrhea, no or blood. Endorses marijuana use.     Received zofran with EMS.    Had elective liposuction 6 weeks prior.  Last saw surgeon for f/u 3 weeks prior.      Review of Systems  Positive: Nausea, vomiting,  Negative: Fever, chills   Physical Exam  There were no vitals taken for this visit. Gen:   Awake, no distress   HEENT:  Atraumatic  Resp:  Normal effort  Cardiac:  Normal rate  Abd:   Nondistended, soft, generalized tenderness throughout  MSK:   Moves extremities without difficulty  Neuro:  Speech clear   Medical Decision Making  Medically screening exam initiated at 2:21 PM.  Appropriate orders placed.  Laurie Key was informed that the remainder of the evaluation will be completed by another provider, this initial triage assessment does not replace that evaluation, and the importance of remaining in the ED until their evaluation is complete.  Clinical Impression   Nausea, vomiting, generalized abdominal pain.  The patient appears stable so that the remainder of the work up may be completed by another provider.     Laurie Key, [place] 08/25/20 1428

## 2020-08-25 NOTE — ED Triage Notes (Signed)
Pt here from home with c/o abd pain and n/v that started last night pt has some elective liposuction done about 6 weeks ago in [place]

## 2020-08-25 NOTE — Discharge Instructions (Addendum)
Please be aware, we gave you medicine in the emergency department today that will make you very sleepy and tired.  This should wear out of your system by tomorrow morning.  When you get home, have your family help you to bed.  Drink plenty of water tonight.  Be careful getting up, as you may feel dizzy or lightheaded.  Have someone help you when getting up.  *  Your medical work-up today was reassuring.  I think you may have a viral illness.  You should be feeling better over the next 2 to 3 days.  Stick to drinking water and fluids only for the next day.  You can have soup and some crackers, but try not to eat a lot of heavy food for the next 24 hours.  I prescribed Zofran tablets for nausea.  You can also take Tylenol and Motrin for muscle aches.  Stay in bed, drink plenty of water, and get rest.  Do not smoke cigarettes, marijuana, or use any illicit drugs.  Marijuana in particular can cause worsening vomiting.  Your Covid and Flu test today were NEGATIVE.  If you begin having worsening pain and vomiting, especially pain in the right lower part or right upper part of your abdomen (belly), return to the ER.  These may be signs or symptoms of developing conditions like appendicitis or gallbladder problems.  Also, if you are vomiting unable to keep down water for 24 hours, or begin to feel lightheaded, please return to the ER.

## 2020-08-25 NOTE — ED Notes (Signed)
Reviewed discharge instructions with patient and sister. Follow-up care and medications reviewed. Patient and sister verbalized understanding. Patient A&Ox4, VSS and pt able to walk to wheelchair upon discharge.

## 2020-08-25 NOTE — ED Notes (Signed)
Pt rolled to room in recliner. Pt sleeping and in NAD at this time.

## 2020-08-25 NOTE — ED Notes (Signed)
Pt ambulated to bathroom and is provided urine specimen

## 2020-08-25 NOTE — ED Provider Notes (Signed)
[facility] EMERGENCY DEPARTMENT Provider Note   CSN: [id number] Arrival date & time: 08/25/20  1418     History CC:  Sick all over  Laurie Key is a 35 y.o. female presented to emergency department with chills, shaking, muscle aches.  She reports abrupt onset of her symptoms morning.  She said she was nauseous and had about 5 episodes of vomiting earlier today.  Her nausea has since resolved.  She was given Zofran by EMS.  She denies any diarrhea constipation a regular bowel movement this morning.  She reports diffuse muscle aches and feeling "sick all over".  She denies headache, sore throat.  She denies coughing.  She denies dysuria.  She has not received any vaccinations for COVID or the flu.  She denies any sick contacts in the house.  She reports no other medical problems.  She reports no new medications.  She did have liposuction 2 months ago as an elective procedure in [place].  She denies any abdominal pain.  HPI     History reviewed. No pertinent past medical history.  There are no problems to display for this patient.   Past Surgical History:  Procedure Laterality Date  . [employer] Lift     6 months  . CLOSED REDUCTION FINGER WITH PERCUTANEOUS PINNING Left 02/25/2020   Procedure: CLOSED REDUCTION FINGER WITH PERCUTANEOUS PINNING;  Surgeon: Dominica Severin, MD;  Location: [facility] OR;  Service: Orthopedics;  Laterality: Left;  . I & D EXTREMITY Left 02/25/2020   Procedure: IRRIGATION AND DEBRIDEMENT EXTREMITY;  Surgeon: Dominica Severin, MD;  Location: [facility] OR;  Service: Orthopedics;  Laterality: Left;     OB History   No obstetric history on file.     History reviewed. No pertinent family history.  Social History   Tobacco Use  . Smoking status: Current Every Day Smoker    Packs/day: 0.50  . Smokeless tobacco: Never Used  Vaping Use  . Vaping Use: Never used  Substance Use Topics  . Alcohol use: No  . Drug use: No    Home  Medications Prior to Admission medications   Medication Sig Start Date End Date Taking? Authorizing Provider  chlorhexidine (PERIDEX) 0.12 % solution Use as directed 15 mLs in the mouth or throat 2 (two) times daily. 08/17/20  Yes [provider]  ondansetron (ZOFRAN ODT) 4 MG disintegrating tablet Take 1 tablet (4 mg total) by mouth every 8 (eight) hours as needed for up to 15 days for nausea or vomiting. 08/25/20 09/09/20 Yes Siddiq Kaluzny, Kermit Balo, MD  PREVIDENT 5000 SENSITIVE 1.1-5 % GEL Place 1 strip onto teeth as directed. Brush. 08/17/20  Yes [provider]  ibuprofen (ADVIL) 800 MG tablet Take 800 mg by mouth every 6 (six) hours as needed for pain. Patient not taking: Reported on 08/25/2020 08/17/20   [provider]  oxyCODONE (ROXICODONE) 5 MG immediate release tablet Take 1 tablet (5 mg total) by mouth every 4 (four) hours as needed. Patient not taking: Reported on 08/25/2020 02/25/20 02/24/21  Dominica Severin, MD    Allergies    Patient has no known allergies.  Review of Systems   Review of Systems  Constitutional: Positive for appetite change, chills and fatigue. Negative for fever.  HENT: Negative for ear pain and sore throat.   Eyes: Negative for pain and visual disturbance.  Respiratory: Negative for cough and shortness of breath.   Cardiovascular: Negative for chest pain and palpitations.  Gastrointestinal: Positive for nausea and vomiting.  Negative for abdominal pain, constipation and diarrhea.  Genitourinary: Negative for dysuria and hematuria.  Musculoskeletal: Positive for arthralgias and myalgias.  Skin: Negative for color change and rash.  Neurological: Negative for syncope, light-headedness and headaches.  All other systems reviewed and are negative.   Physical Exam Updated Vital Signs BP 102/70 (BP Location: Left Arm)   Pulse (!) 54   Temp 98.2 F (36.8 C) (Oral)   Resp 17   Ht 5\' 8"  (1.727 m)   Wt 81.6 kg   SpO2 100%   BMI 27.35 kg/m    Physical Exam Constitutional:      General: She is not in acute distress.    Comments: Shaking  HENT:     Head: Normocephalic and atraumatic.  Eyes:     Conjunctiva/sclera: Conjunctivae normal.     Pupils: Pupils are equal, round, and reactive to light.  Cardiovascular:     Rate and Rhythm: Normal rate and regular rhythm.     Pulses: Normal pulses.  Pulmonary:     Effort: Pulmonary effort is normal. No respiratory distress.  Abdominal:     General: There is no distension.     Palpations: There is no mass.     Tenderness: There is no abdominal tenderness. There is no guarding.  Skin:    General: Skin is warm and dry.  Neurological:     General: No focal deficit present.     Mental Status: She is alert. Mental status is at baseline.  Psychiatric:        Mood and Affect: Mood normal.        Behavior: Behavior normal.     ED Results / Procedures / Treatments   Labs (all labs ordered are listed, but only abnormal results are displayed) Labs Reviewed  CBC WITH DIFFERENTIAL/PLATELET - Abnormal; Notable for the following components:      Result Value   Neutro Abs 8.1 (*)    All other components within normal limits  COMPREHENSIVE METABOLIC PANEL - Abnormal; Notable for the following components:   Potassium 3.3 (*)    Glucose, Bld 127 (*)    BUN <5 (*)    All other components within normal limits  URINALYSIS, ROUTINE W REFLEX MICROSCOPIC - Abnormal; Notable for the following components:   pH 9.0 (*)    Ketones, ur 80 (*)    Protein, ur 100 (*)    All other components within normal limits  RESP PANEL BY RT-PCR (FLU A&B, COVID) ARPGX2  LIPASE, BLOOD  I-STAT BETA HCG BLOOD, ED ([facility], [facility], [facility] ONLY)    EKG EKG Interpretation  Date/Time:  Friday August 25 2020 14:26:02 EDT Ventricular Rate:  52 PR Interval:  184 QRS Duration: 82 QT Interval:  456 QTC Calculation: 424 R Axis:   67 Text Interpretation: Sinus bradycardia with sinus arrhythmia Otherwise normal ECG Baseline  artifact V4, otherwise unremarkable ECG Confirmed by 12[birth year]1978 [phone]) on 08/25/2020 3:57:46 PM   Radiology DG Chest Portable 1 View  Result Date: 08/25/2020 CLINICAL DATA:  Right ureters.  Nausea and vomiting. EXAM: PORTABLE CHEST 1 VIEW COMPARISON:  01/14/2017 FINDINGS: The lungs are clear without focal pneumonia, edema, pneumothorax or pleural effusion. The cardiopericardial silhouette is within normal limits for size. The visualized bony structures of the thorax show no acute abnormality. IMPRESSION: No active disease. Electronically Signed   By: 01/16/2017 M.D.   On: 08/25/2020 16:59    Procedures Procedures   Medications Ordered in ED Medications  ketorolac (TORADOL) 30  MG/ML injection 30 mg (30 mg Intravenous Given 08/25/20 1624)  LORazepam (ATIVAN) injection 1 mg (1 mg Intravenous Given 08/25/20 1626)  ondansetron (ZOFRAN) injection 4 mg (4 mg Intravenous Given 08/25/20 1626)  haloperidol lactate (HALDOL) injection 1 mg (1 mg Intravenous Given 08/25/20 1828)    ED Course  I have reviewed the triage vital signs and the nursing notes.  Pertinent labs & imaging results that were available during my care of the patient were reviewed by me and considered in my medical decision making (see chart for details).  This patient complains of nausea, vomiting, chills.  This involves an extensive number of treatment options, and is a complaint that carries with it a high risk of complications and morbidity.  The differential diagnosis includes viral infection (most likely) vs UTI vs other infection  With her myalgias, nausea, fatigue, I suspect this is most likely a viral syndrome.  She has not been vaccinated for Covid.  We can check her Covid and flu test at this time.  She does not meet SIRS criteria at this time.  Abdominal exam is completely benign.  Her liposuction surgery was nearly 2 months ago, the scars have healed well healed, with no erythema or tenderness.  I have a lower  suspicion intra-abdominal infection, including cholecystitis or appendicitis based on his exam.  She has no signs or symptoms of meningitis.  Chest x-ray ordered and reviewed.  No focal infiltrates.  No cough or hypoxia (only some minor desaturations with sleeping, likely apnea related given her larger habitus).  UA also reviewed showing ketones, protein, no sign of infection.  I ordered, reviewed, and interpreted labs.  No life-threatening abnormalities were noted on these tests. I ordered medication IV ativan, IV zofran, IV toradol for nausea, pain, shakes I ordered imaging studies which included DG chest I independently visualized and interpreted imaging which showed no life-threatening abnormalities, and the monitor tracing which showed NSR    Clinical Course as of 08/26/20 0051  Fri Aug 25, 2020  1711 IMPRESSION: No active disease. [MT]  1745 UA without sign of infection [MT]  1945 Patient tolerated p.o. and is feeling better.  She is extremely groggy from her medications.  I've asked nursing staff to attempt to ambulate.  If she can she lives with her boyfriend who can come get her.  I will prescribe zofran and phenergan for home.  I suspect this is a viral illness, likely to improve over the next 2-3 days. [MT]    Clinical Course User Index [MT] Terald Sleeper, MD    Final Clinical Impression(s) / ED Diagnoses Final diagnoses:  Non-intractable vomiting with nausea, unspecified vomiting type    Rx / DC Orders ED Discharge Orders         Ordered    ondansetron (ZOFRAN ODT) 4 MG disintegrating tablet  Every 8 hours PRN        08/25/20 2020           Terald Sleeper, MD 08/26/20 [phone]

## 2020-08-26 ENCOUNTER — Other Ambulatory Visit: Payer: Self-pay

## 2020-08-26 ENCOUNTER — Emergency Department (HOSPITAL_COMMUNITY)
Admission: EM | Admit: 2020-08-26 | Discharge: 2020-08-27 | Disposition: A | Discharge status: Home / Self Care | Payer: Medicaid Other | Attending: Emergency Medicine | Admitting: Emergency Medicine

## 2020-08-26 DIAGNOSIS — R112 Nausea with vomiting, unspecified: Secondary | ICD-10-CM

## 2020-08-26 DIAGNOSIS — R197 Diarrhea, unspecified: Secondary | ICD-10-CM | POA: Insufficient documentation

## 2020-08-26 DIAGNOSIS — E860 Dehydration: Secondary | ICD-10-CM | POA: Diagnosis not present

## 2020-08-26 DIAGNOSIS — R1084 Generalized abdominal pain: Secondary | ICD-10-CM | POA: Insufficient documentation

## 2020-08-26 DIAGNOSIS — F17200 Nicotine dependence, unspecified, uncomplicated: Secondary | ICD-10-CM | POA: Diagnosis not present

## 2020-08-26 DIAGNOSIS — R1110 Vomiting, unspecified: Secondary | ICD-10-CM | POA: Diagnosis present

## 2020-08-27 ENCOUNTER — Encounter (HOSPITAL_COMMUNITY): Payer: Self-pay | Admitting: Emergency Medicine

## 2020-08-27 LAB — CBC
HCT: 40.6 % (ref 36.0–46.0)
Hemoglobin: 13.9 g/dL (ref 12.0–15.0)
MCH: 33.3 pg (ref 26.0–34.0)
MCHC: 34.2 g/dL (ref 30.0–36.0)
MCV: 97.4 fL (ref 80.0–100.0)
Platelets: 254 10*3/uL (ref 150–400)
RBC: 4.17 MIL/uL (ref 3.87–5.11)
RDW: 12.4 % (ref 11.5–15.5)
WBC: 9.8 10*3/uL (ref 4.0–10.5)
nRBC: 0 % (ref 0.0–0.2)

## 2020-08-27 LAB — COMPREHENSIVE METABOLIC PANEL
ALT: 12 U/L (ref 0–44)
AST: 18 U/L (ref 15–41)
Albumin: 4.4 g/dL (ref 3.5–5.0)
Alkaline Phosphatase: 50 U/L (ref 38–126)
Anion gap: 15 (ref 5–15)
BUN: 14 mg/dL (ref 6–20)
CO2: 22 mmol/L (ref 22–32)
Calcium: 9.4 mg/dL (ref 8.9–10.3)
Chloride: 101 mmol/L (ref 98–111)
Creatinine, Ser: 0.64 mg/dL (ref 0.44–1.00)
GFR, Estimated: >60 mL/min (ref >60)
Glucose, Bld: 104 mg/dL — ABNORMAL HIGH (ref 70–99)
Potassium: 3.2 mmol/L — ABNORMAL LOW (ref 3.5–5.1)
Sodium: 138 mmol/L (ref 135–145)
Total Bilirubin: 0.7 mg/dL (ref 0.3–1.2)
Total Protein: 8.2 g/dL — ABNORMAL HIGH (ref 6.5–8.1)

## 2020-08-27 LAB — I-STAT BETA HCG BLOOD, ED (MC, WL, AP ONLY): I-stat hCG, quantitative: <5 m[IU]/mL (ref <5)

## 2020-08-27 LAB — LIPASE, BLOOD: Lipase: 26 U/L (ref 11–51)

## 2020-08-27 MED ORDER — ACETAMINOPHEN 500 MG PO TABS
1000.0000 mg | ORAL_TABLET | Freq: Once | ORAL | Status: AC
  Administered 2020-08-27: 1000 mg via ORAL
  Filled 2020-08-27: qty 2

## 2020-08-27 MED ORDER — PROMETHAZINE HCL 25 MG RE SUPP: 25.0000 mg | Freq: Four times a day (QID) | RECTAL | 0 refills | Status: AC | PRN

## 2020-08-27 MED ORDER — METOCLOPRAMIDE HCL 5 MG/ML IJ SOLN
10.0000 mg | Freq: Once | INTRAMUSCULAR | Status: AC
  Administered 2020-08-27: 10 mg via INTRAVENOUS
  Filled 2020-08-27: qty 2

## 2020-08-27 MED ORDER — POTASSIUM CHLORIDE CRYS ER 20 MEQ PO TBCR
40.0000 meq | EXTENDED_RELEASE_TABLET | Freq: Once | ORAL | Status: AC
  Administered 2020-08-27: 40 meq via ORAL

## 2020-08-27 MED ORDER — LACTATED RINGERS IV BOLUS
1000.0000 mL | Freq: Once | INTRAVENOUS | Status: AC
  Administered 2020-08-27: 1000 mL via INTRAVENOUS

## 2020-08-27 NOTE — ED Provider Notes (Signed)
Emergency Department Provider Note  I have reviewed the triage vital signs and the nursing notes.  HISTORY  Chief Complaint Emesis   HPI Laurie Key is a 35 y.o. female who presents the emerge department today with vomiting abdominal pain.  Patient states been going on for 2 days.  She denies using any illicit drugs.  She states that she was seen in give some medication which made her vomit worse however referring to the physician's note it sounds like that her vomiting improved prior to discharge.  Patient states that she has generalized abdominal cramping but nothing focal.  No sick contacts.  Some diarrhea but no constipation.  No fevers   No other associated or modifying symptoms.    History reviewed. No pertinent past medical history.  There are no problems to display for this patient.   Past Surgical History:  Procedure Laterality Date  . [employer] Lift     6 months  . CLOSED REDUCTION FINGER WITH PERCUTANEOUS PINNING Left 02/25/2020   Procedure: CLOSED REDUCTION FINGER WITH PERCUTANEOUS PINNING;  Surgeon: Dominica Severin, MD;  Location: [facility] OR;  Service: Orthopedics;  Laterality: Left;  . I & D EXTREMITY Left 02/25/2020   Procedure: IRRIGATION AND DEBRIDEMENT EXTREMITY;  Surgeon: Dominica Severin, MD;  Location: [facility] OR;  Service: Orthopedics;  Laterality: Left;    Current Outpatient Rx  . Order #: [id number] Class: Normal  . Order #: [id number] Class: Historical Med  . Order #: [id number] Class: Print  . Order #: [id number] Class: Historical Med    Allergies Patient has no known allergies.  History reviewed. No pertinent family history.  Social History Social History   Tobacco Use  . Smoking status: Current Every Day Smoker    Packs/day: 0.50  . Smokeless tobacco: Never Used  Vaping Use  . Vaping Use: Never used  Substance Use Topics  . Alcohol use: No  . Drug use: No    Review of Systems  All other systems negative except as documented in the HPI. All  pertinent positives and negatives as reviewed in the HPI. ____________________________________________  PHYSICAL EXAM:  VITAL SIGNS: ED Triage Vitals  Enc Vitals Group     BP 08/26/20 2338 (!) 144/99     Pulse Rate 08/26/20 2338 (!) 109     Resp 08/26/20 2338 17     Temp 08/26/20 2338 99.1 F (37.3 C)     Temp Source 08/26/20 2338 Oral     SpO2 08/26/20 2338 100 %     Weight 08/26/20 2338 181 lb (82.1 kg)     Height 08/26/20 2338 5\' 8"  (1.727 m)    Constitutional: Alert and oriented. Well appearing and in no acute distress. Eyes: Conjunctivae are normal. PERRL. EOMI. Head: Atraumatic. Nose: No congestion/rhinnorhea. Mouth/Throat: Mucous membranes are moist.  Oropharynx non-erythematous. Neck: No stridor.  No meningeal signs.   Cardiovascular: tachycardic rate, regular rhythm. Good peripheral circulation. Grossly normal heart sounds.   Respiratory: Normal respiratory effort.  No retractions. Lungs CTAB. Gastrointestinal: Soft and nontender. No distention.  Musculoskeletal: No lower extremity tenderness nor edema. No gross deformities of extremities. Neurologic:  Normal speech and language. No gross focal neurologic deficits are appreciated.  Skin:  Skin is warm, dry and intact. No rash noted.  ____________________________________________   LABS (all labs ordered are listed, but only abnormal results are displayed)  Labs Reviewed  COMPREHENSIVE METABOLIC PANEL - Abnormal; Notable for the following components:      Result Value   Potassium 3.2 (*)  Glucose, Bld 104 (*)    Total Protein 8.2 (*)    All other components within normal limits  LIPASE, BLOOD  CBC  URINALYSIS, ROUTINE W REFLEX MICROSCOPIC  RAPID URINE DRUG SCREEN, HOSP PERFORMED  I-STAT BETA HCG BLOOD, ED ([facility], [facility], [facility] ONLY)   ____________________________________________  INITIAL IMPRESSION / ASSESSMENT AND PLAN    This patient presents to the ED for concern of emesis, this involves an extensive number of  treatment options, and is a complaint that carries with it a high risk of complications and morbidity.  The differential diagnosis includes bowel obstruction, gastorenteritis (bacterial vs viral), UTI.   Additional history obtained:   Additional history obtained from chart  Previous records obtained and reviewed in epic  ED Course      Medicines ordered:  Medications  lactated ringers bolus 1,000 mL (0 mLs Intravenous Stopped 08/27/20 0215)  metoCLOPramide (REGLAN) injection 10 mg (10 mg Intravenous Given 08/27/20 0106)  acetaminophen (TYLENOL) tablet 1,000 mg (1,000 mg Oral Given 08/27/20 0250)  potassium chloride SA (KLOR-CON) CR tablet 40 mEq (40 mEq Oral Given 08/27/20 0249)    Consultations Obtained:   I consulted noone  and discussed lab and imaging findings   CRITICAL INTERVENTIONS:  . Anti-emetics, laboratory workup  Reevaluation:  After the interventions stated above, I reevaluated the patient and found improved symptoms. Stable for discharge.   FINAL IMPRESSION AND PLAN Final diagnoses:  Non-intractable vomiting with nausea, unspecified vomiting type  Dehydration    A medical screening exam was performed and I feel the patient has had an appropriate workup for their chief complaint at this time and likelihood of emergent condition existing is low. They have been counseled on decision, DISCHARGE, follow up and which symptoms necessitate immediate return to the emergency department. They or their family verbally stated understanding and agreement with plan and discharged in stable condition.   ____________________________________________   NEW OUTPATIENT MEDICATIONS STARTED DURING THIS VISIT:  Discharge Medication List as of 08/27/2020  3:38 AM    START taking these medications   Details  promethazine (PHENERGAN) 25 MG suppository Place 1 suppository (25 mg total) rectally every 6 (six) hours as needed for nausea or vomiting., Starting Sun 08/27/2020, Normal         Note:  This note was prepared with assistance of Dragon voice recognition software. Occasional wrong-word or sound-a-like substitutions may have occurred due to the inherent limitations of voice recognition software.   Federick Levene, Barbara Cower, MD 08/27/20 [phone]

## 2020-08-27 NOTE — ED Triage Notes (Signed)
Patient is complaining of being nauseated and vomiting for the last two days.

## 2022-11-25 IMAGING — DX DG CHEST 1V PORT
1 series · 1 of 1 positions shown · non-contrast
Comparison: 01/14/2017

CLINICAL DATA: Right ureters.  Nausea and vomiting.

EXAM:
PORTABLE CHEST 1 VIEW

[chest ap]
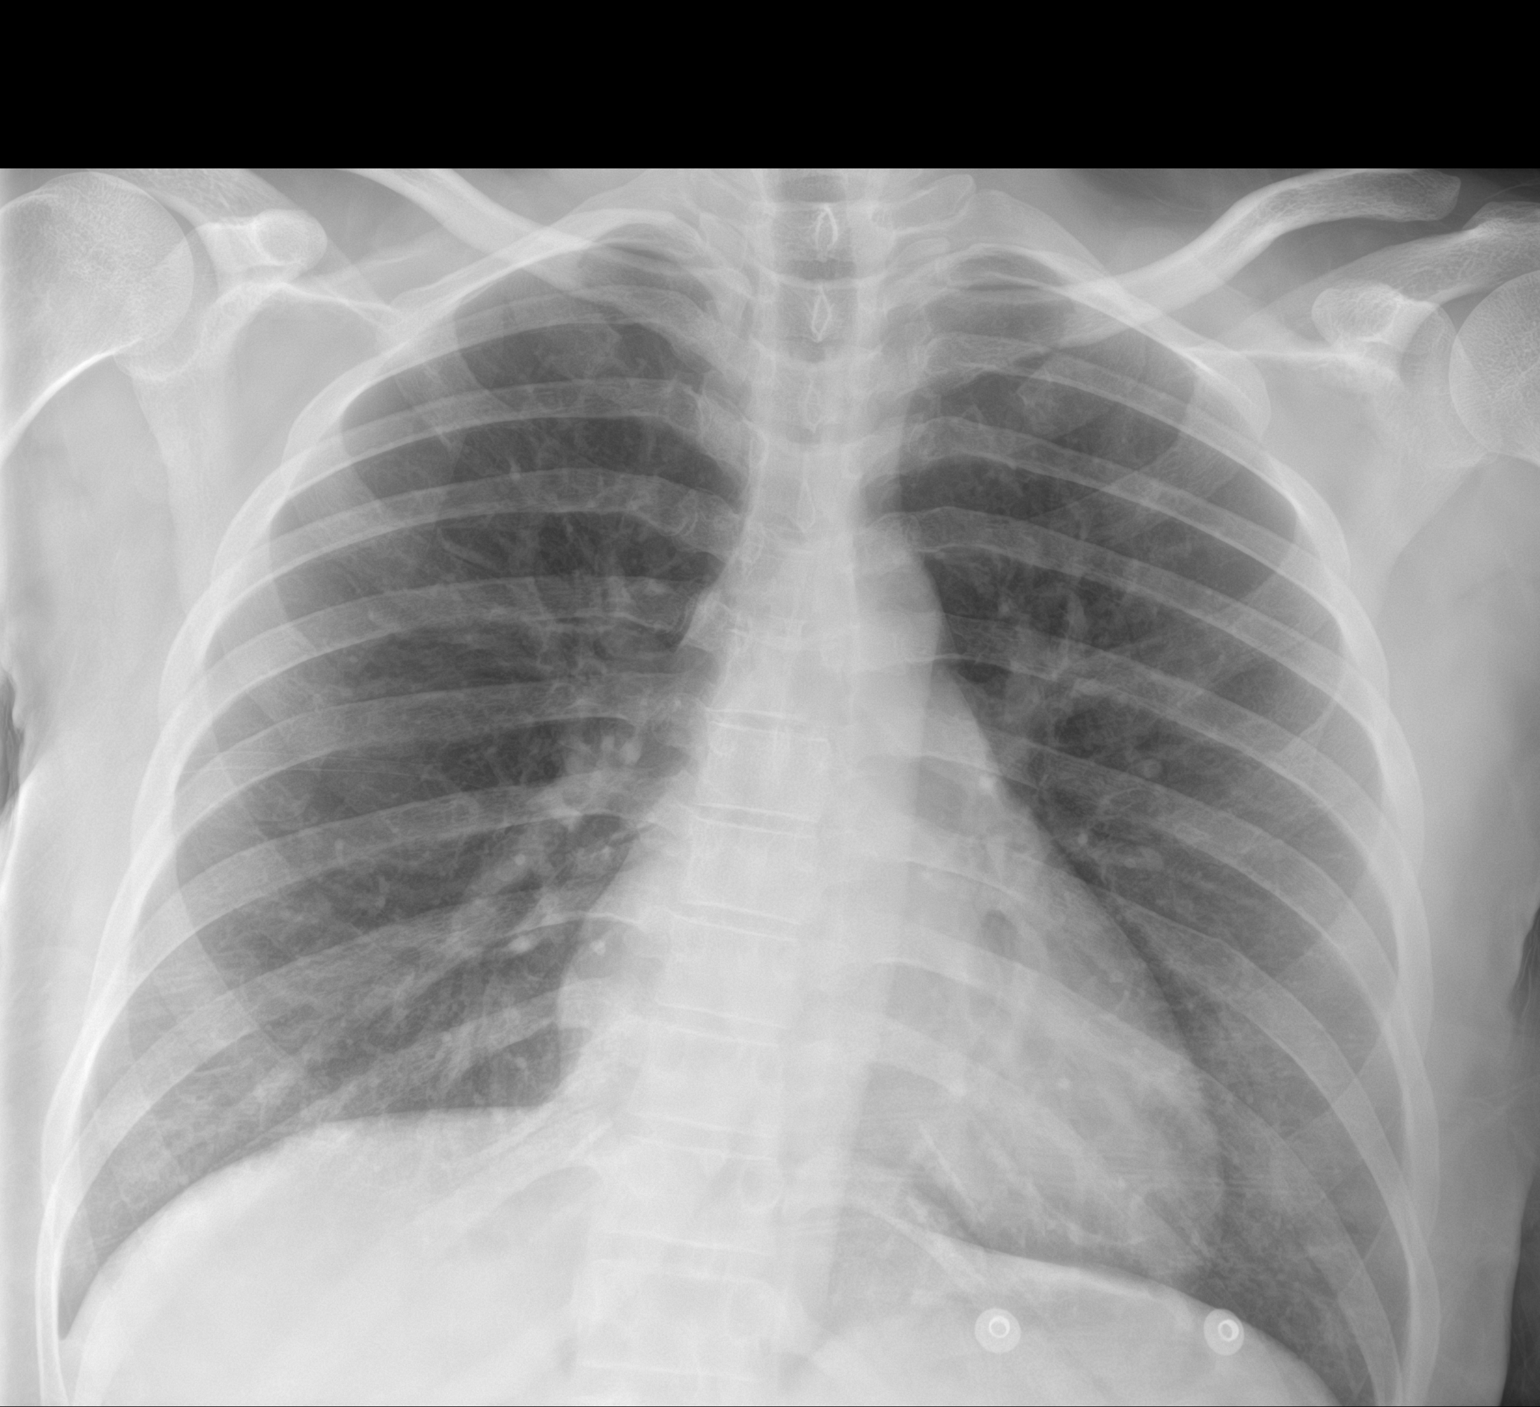

[1 of 1 positions shown; findings below may reference images not displayed]

FINDINGS: The lungs are clear without focal pneumonia, edema, pneumothorax or
pleural effusion. The cardiopericardial silhouette is within normal
limits for size. The visualized bony structures of the thorax show
no acute abnormality.
IMPRESSION: No active disease.
